# Patient Record
Sex: Male | Born: 1950 | Race: Black or African American | Hispanic: No | Marital: Single | Smoking: Former smoker
Health system: Southern US, Community
[De-identification: ages and names within clinical notes are randomized; demographics above are authoritative.]

## PROBLEM LIST (undated history)

## (undated) DIAGNOSIS — K635 Polyp of colon: Secondary | ICD-10-CM

## (undated) DIAGNOSIS — E785 Hyperlipidemia, unspecified: Secondary | ICD-10-CM

## (undated) DIAGNOSIS — J45909 Unspecified asthma, uncomplicated: Secondary | ICD-10-CM

## (undated) DIAGNOSIS — I10 Essential (primary) hypertension: Secondary | ICD-10-CM

## (undated) HISTORY — DX: Polyp of colon: K63.5

## (undated) HISTORY — DX: Essential (primary) hypertension: I10

## (undated) HISTORY — DX: Unspecified asthma, uncomplicated: J45.909

---
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
## 2008-03-05 ENCOUNTER — Ambulatory Visit: Payer: Self-pay | Admitting: Gastroenterology

## 2008-03-18 ENCOUNTER — Ambulatory Visit: Payer: Self-pay | Admitting: Gastroenterology

## 2008-03-18 ENCOUNTER — Encounter: Payer: Self-pay | Admitting: Gastroenterology

## 2008-03-20 ENCOUNTER — Encounter: Payer: Self-pay | Admitting: Gastroenterology

## 2013-01-18 ENCOUNTER — Encounter: Payer: Self-pay | Admitting: Gastroenterology

## 2013-02-20 ENCOUNTER — Telehealth: Payer: Self-pay | Admitting: *Deleted

## 2013-02-20 ENCOUNTER — Encounter: Payer: Self-pay | Admitting: *Deleted

## 2013-02-20 NOTE — Telephone Encounter (Signed)
Pt no show for previsit appointment. Message left to call and reschedule previsit before 5pm today. If not rescheduled today before 5p, colonoscopy appointment will be cancelled as well and both previsit and colonoscopy will need to be rescheduled.

## 2013-02-23 ENCOUNTER — Encounter: Payer: Self-pay | Admitting: Gastroenterology

## 2013-03-07 ENCOUNTER — Other Ambulatory Visit: Payer: Self-pay | Admitting: Gastroenterology

## 2013-08-20 ENCOUNTER — Encounter: Payer: Self-pay | Admitting: Gastroenterology

## 2013-10-04 HISTORY — PX: COLONOSCOPY W/ BIOPSIES: SHX1374

## 2013-10-11 ENCOUNTER — Other Ambulatory Visit: Payer: Self-pay | Admitting: Dermatology

## 2013-10-15 DIAGNOSIS — Z87891 Personal history of nicotine dependence: Secondary | ICD-10-CM | POA: Insufficient documentation

## 2014-01-23 DIAGNOSIS — N529 Male erectile dysfunction, unspecified: Secondary | ICD-10-CM | POA: Insufficient documentation

## 2014-01-23 DIAGNOSIS — R972 Elevated prostate specific antigen [PSA]: Secondary | ICD-10-CM | POA: Insufficient documentation

## 2014-01-23 DIAGNOSIS — J45909 Unspecified asthma, uncomplicated: Secondary | ICD-10-CM | POA: Insufficient documentation

## 2014-01-23 DIAGNOSIS — L210 Seborrhea capitis: Secondary | ICD-10-CM | POA: Insufficient documentation

## 2014-01-31 ENCOUNTER — Encounter: Payer: Self-pay | Admitting: Gastroenterology

## 2014-02-28 ENCOUNTER — Ambulatory Visit (AMBULATORY_SURGERY_CENTER): Payer: Self-pay | Admitting: *Deleted

## 2014-02-28 VITALS — Ht 75.0 in | Wt 216.4 lb

## 2014-02-28 DIAGNOSIS — Z86010 Personal history of colonic polyps: Secondary | ICD-10-CM

## 2014-02-28 MED ORDER — MOVIPREP 100 G PO SOLR: ORAL | Status: DC

## 2014-02-28 NOTE — Progress Notes (Signed)
No allergies to eggs or soy. No problems with anesthesia.  Pt given Emmi instructions for colonoscopy  No oxygen use  No diet drug use  

## 2014-03-13 ENCOUNTER — Encounter: Payer: Medicare Other | Admitting: Gastroenterology

## 2014-03-15 ENCOUNTER — Encounter: Payer: Self-pay | Admitting: Gastroenterology

## 2014-03-18 ENCOUNTER — Encounter: Payer: Self-pay | Admitting: Gastroenterology

## 2014-03-18 ENCOUNTER — Ambulatory Visit (AMBULATORY_SURGERY_CENTER): Payer: Medicare Other | Admitting: Gastroenterology

## 2014-03-18 VITALS — BP 129/78 | HR 76 | Temp 96.8°F | Resp 15 | Ht 75.0 in | Wt 216.0 lb

## 2014-03-18 DIAGNOSIS — Z86010 Personal history of colonic polyps: Secondary | ICD-10-CM

## 2014-03-18 DIAGNOSIS — D128 Benign neoplasm of rectum: Secondary | ICD-10-CM

## 2014-03-18 DIAGNOSIS — D126 Benign neoplasm of colon, unspecified: Secondary | ICD-10-CM

## 2014-03-18 DIAGNOSIS — D129 Benign neoplasm of anus and anal canal: Secondary | ICD-10-CM

## 2014-03-18 MED ORDER — SODIUM CHLORIDE 0.9 % IV SOLN: 500.0000 mL | INTRAVENOUS | Status: DC

## 2014-03-18 NOTE — Progress Notes (Signed)
Called to room to assist during endoscopic procedure.  Patient ID and intended procedure confirmed with present staff. Received instructions for my participation in the procedure from the performing physician.  

## 2014-03-18 NOTE — Progress Notes (Signed)
Report to PACU, RN, vss, BBS= Clear.  

## 2014-03-18 NOTE — Progress Notes (Addendum)
Pt. Unable to expell air even with repositioning.1547 hyoscyamine SL administered. Abdomen soft non distended.1558 Pt. Ambulated to bathroom without difficulty. Pt. 9675 pt. Expelling air in bathroom and denies pain. Dr. Ardis Hughs made aware. Order received to D/C.

## 2014-03-18 NOTE — Op Note (Signed)
[facility]  [address] [place], [id number]   COLONOSCOPY PROCEDURE REPORT  PATIENT: Gregery, Walberg  MR#: [id number] BIRTHDATE: [birth year] , 5  yrs. old GENDER: Male ENDOSCOPIST: Milus Banister, MD PROCEDURE DATE:  03/18/2014 PROCEDURE:   Colonoscopy with snare polypectomy First Screening Colonoscopy - Avg.  risk and is 50 yrs.  old or older - No.  Prior Negative Screening - Now for repeat screening. N/A  History of Adenoma - Now for follow-up colonoscopy & has been > or = to 3 yrs.  Yes hx of adenoma.  Has been 3 or more years since last colonoscopy.  Polyps Removed Today? Yes. ASA CLASS:   Class II INDICATIONS:single subcentimeter TA removed 2009. MEDICATIONS: MAC sedation, administered by CRNA and propofol (Diprivan) 300mg  IV  DESCRIPTION OF PROCEDURE:   After the risks benefits and alternatives of the procedure were thoroughly explained, informed consent was obtained.  A digital rectal exam revealed no abnormalities of the rectum.   The LB [id number]  endoscope was introduced through the anus and advanced to the cecum, which was identified by both the appendix and ileocecal valve. No adverse events experienced.   The quality of the prep was good.  The instrument was then slowly withdrawn as the colon was fully examined.  COLON FINDINGS: Two small polyps were found, removed and sent to pathology.  These were both sessile, located in descending and rectum segments, 1-15mm across, removed with cold snare.  The examination was otherwise normal.  Retroflexed views revealed no abnormalities. The time to cecum=2 minutes 18 seconds.  Withdrawal time=11 minutes 21 seconds.  The scope was withdrawn and the procedure completed. COMPLICATIONS: There were no complications.  ENDOSCOPIC IMPRESSION: Two small polyps were found, removed and sent to pathology. The examination was otherwise normal.  RECOMMENDATIONS: If the polyp(s) removed today are proven  to be adenomatous (pre-cancerous) polyps, you will need a repeat colonoscopy in 5 years.  Otherwise you should continue to follow colorectal cancer screening guidelines for "routine risk" patients with colonoscopy in 10 years.  You will receive a letter within 1-2 weeks with the results of your biopsy as well as final recommendations.  Please call my office if you have not received a letter after 3 weeks.   eSigned:  Milus Banister, MD 03/18/2014 3:24 PM

## 2014-03-18 NOTE — Patient Instructions (Signed)
YOU HAD AN ENDOSCOPIC PROCEDURE TODAY AT THE  ENDOSCOPY CENTER: Refer to the procedure report that was given to you for any specific questions about what was found during the examination.  If the procedure report does not answer your questions, please call your gastroenterologist to clarify.  If you requested that your care partner not be given the details of your procedure findings, then the procedure report has been included in a sealed envelope for you to review at your convenience later.  YOU SHOULD EXPECT: Some feelings of bloating in the abdomen. Passage of more gas than usual.  Walking can help get rid of the air that was put into your GI tract during the procedure and reduce the bloating. If you had a lower endoscopy (such as a colonoscopy or flexible sigmoidoscopy) you may notice spotting of blood in your stool or on the toilet paper. If you underwent a bowel prep for your procedure, then you may not have a normal bowel movement for a few days.  DIET: Your first meal following the procedure should be a light meal and then it is ok to progress to your normal diet.  A half-sandwich or bowl of soup is an example of a good first meal.  Heavy or fried foods are harder to digest and may make you feel nauseous or bloated.  Likewise meals heavy in dairy and vegetables can cause extra gas to form and this can also increase the bloating.  Drink plenty of fluids but you should avoid alcoholic beverages for 24 hours.  ACTIVITY: Your care partner should take you home directly after the procedure.  You should plan to take it easy, moving slowly for the rest of the day.  You can resume normal activity the day after the procedure however you should NOT DRIVE or use heavy machinery for 24 hours (because of the sedation medicines used during the test).    SYMPTOMS TO REPORT IMMEDIATELY: A gastroenterologist can be reached at any hour.  During normal business hours, 8:30 AM to 5:00 PM Monday through Friday,  call [phone].  After hours and on weekends, please call the GI answering service at [phone] who will take a message and have the physician on call contact you.   Following lower endoscopy (colonoscopy or flexible sigmoidoscopy):  Excessive amounts of blood in the stool  Significant tenderness or worsening of abdominal pains  Swelling of the abdomen that is new, acute  Fever of 100F or higher  FOLLOW UP: If any biopsies were taken you will be contacted by phone or by letter within the next 1-3 weeks.  Call your gastroenterologist if you have not heard about the biopsies in 3 weeks.  Our staff will call the home number listed on your records the next business day following your procedure to check on you and address any questions or concerns that you may have at that time regarding the information given to you following your procedure. This is a courtesy call and so if there is no answer at the home number and we have not heard from you through the emergency physician on call, we will assume that you have returned to your regular daily activities without incident.  SIGNATURES/CONFIDENTIALITY: You and/or your care partner have signed paperwork which will be entered into your electronic medical record.  These signatures attest to the fact that that the information above on your After Visit Summary has been reviewed and is understood.  Full responsibility of the confidentiality of this   discharge information lies with you and/or your care-partner.    Resume medications. Information given on polyps with discharge instructions. 

## 2014-03-19 ENCOUNTER — Telehealth: Payer: Self-pay | Admitting: *Deleted

## 2014-03-19 NOTE — Telephone Encounter (Signed)
  Follow up Call-  Call back number 03/18/2014  Post procedure Call Back phone  # [phone]  Permission to leave phone message Yes    [facility]

## 2014-03-25 ENCOUNTER — Encounter: Payer: Self-pay | Admitting: Gastroenterology

## 2014-03-28 ENCOUNTER — Encounter: Payer: Self-pay | Admitting: *Deleted

## 2014-03-28 ENCOUNTER — Other Ambulatory Visit (INDEPENDENT_AMBULATORY_CARE_PROVIDER_SITE_OTHER): Payer: Medicare Other

## 2014-03-28 ENCOUNTER — Telehealth: Payer: Self-pay | Admitting: Gastroenterology

## 2014-03-28 DIAGNOSIS — K625 Hemorrhage of anus and rectum: Secondary | ICD-10-CM

## 2014-03-28 LAB — CBC WITH DIFFERENTIAL/PLATELET
Basophils Absolute: 0 10*3/uL (ref 0.0–0.1)
Basophils Relative: 0.5 % (ref 0.0–3.0)
EOS PCT: 8.3 % — AB (ref 0.0–5.0)
Eosinophils Absolute: 0.4 10*3/uL (ref 0.0–0.7)
HEMATOCRIT: 42.2 % (ref 39.0–52.0)
HEMOGLOBIN: 14.2 g/dL (ref 13.0–17.0)
Lymphocytes Relative: 32.5 % (ref 12.0–46.0)
Lymphs Abs: 1.4 10*3/uL (ref 0.7–4.0)
MCHC: 33.5 g/dL (ref 30.0–36.0)
MCV: 94.1 fl (ref 78.0–100.0)
Monocytes Absolute: 0.3 10*3/uL (ref 0.1–1.0)
Monocytes Relative: 7.3 % (ref 3.0–12.0)
NEUTROS ABS: 2.3 10*3/uL (ref 1.4–7.7)
NEUTROS PCT: 51.4 % (ref 43.0–77.0)
Platelets: 235 10*3/uL (ref 150.0–400.0)
RBC: 4.49 Mil/uL (ref 4.22–5.81)
RDW: 13.9 % (ref 11.5–15.5)
WBC: 4.4 10*3/uL (ref 4.0–10.5)

## 2014-03-28 NOTE — Telephone Encounter (Signed)
Pt is having BRB in the toilet every time he has a bowel movement since the procedure, usually once per day.  No constipation, hemorrhoids.  No fever or pain.   Please advise

## 2014-03-28 NOTE — Telephone Encounter (Signed)
Pt aware will have labs today and appt with [place] tomorrow

## 2014-03-28 NOTE — Telephone Encounter (Signed)
He needs cbc today.  ROV with [place] tomorrow or early next week (extender or other available MD?)  I am double booked in AM already tomorrow.

## 2014-03-29 ENCOUNTER — Ambulatory Visit: Payer: Medicare Other | Admitting: Gastroenterology

## 2014-04-08 ENCOUNTER — Telehealth: Payer: Self-pay | Admitting: Gastroenterology

## 2014-04-08 NOTE — Telephone Encounter (Signed)
Left message for pt to call back.  Pt called and states that he continues to have BRB in the stool when he has a BM. States it is on the tissue when he wipes also. Pt states he does not have any burning, itching, or pain in the rectal area. Pt is concerned. Please advise.

## 2014-04-09 NOTE — Telephone Encounter (Signed)
rov with extender this week if possible, next week with me if not.  Results letter was sent 1-2 weeks ago, can you read it to him

## 2014-04-09 NOTE — Telephone Encounter (Signed)
Pt has been notified and appt with Nevin Bloodgood has been scheduled

## 2014-04-12 ENCOUNTER — Encounter: Payer: Self-pay | Admitting: Nurse Practitioner

## 2014-04-12 ENCOUNTER — Ambulatory Visit (INDEPENDENT_AMBULATORY_CARE_PROVIDER_SITE_OTHER): Payer: Medicare Other | Admitting: Nurse Practitioner

## 2014-04-12 VITALS — BP 130/80 | HR 76 | Ht 75.0 in | Wt 215.0 lb

## 2014-04-12 DIAGNOSIS — K625 Hemorrhage of anus and rectum: Secondary | ICD-10-CM

## 2014-04-12 DIAGNOSIS — R1012 Left upper quadrant pain: Secondary | ICD-10-CM

## 2014-04-12 DIAGNOSIS — R110 Nausea: Secondary | ICD-10-CM

## 2014-04-12 MED ORDER — HYDROCORTISONE ACETATE 25 MG RE SUPP: RECTAL | Status: DC

## 2014-04-12 NOTE — Patient Instructions (Signed)
We sent a prescription to [facility]. For Anusol HC suppositories. Avoid straining with bowel movements. You can use over the counter stool softners, examples: Colace, Dulcolax.   Call us after treating with the suppositories if you have any more bleeding.

## 2014-04-12 NOTE — Progress Notes (Signed)
     History of Present Illness:   Patient is a 63 year old male known to Dr. Ardis Hughs. He has a history of colon polyps and underwent surveillance colonoscopy on the 16th of last month. A couple of polyps were removed, one was adenomatous. Since colonoscopy patient has been having some painless rectal bleeding with bowel movements. Bowel movements otherwise unremarkable. No abdominal pain.    Current Medications, Allergies, Past Medical History, Past Surgical History, Family History and Social History were reviewed in [employer] record.   Physical Exam: General: Pleasant, well developed , black male in no acute distress Head: Normocephalic and atraumatic Eyes:  sclerae anicteric, conjunctiva pink  Ears: Normal auditory acuity Rectal: Large external hemorrhoidal tags. inflamed internal hemorrhoids on anoscopy Neurological: Alert oriented x 4, grossly nonfocal Psychological:  Alert and cooperative. Normal mood and affect  Assessment and Recommendations:  16. 63 year old mouth with painless rectal bleeding with bowel movements since colonoscopy last month. He does have internal hemorrhoids on anoscopy. Patient does recall straining excessively after the procedure to expel gas, this could have contributed to the internal hemorrhoids. Discourage straining. Will treat with steroid suppositories nightly for 7 days. Patient will call his back he has persistent bleeding  2. adenomatous colon polyps ( last colonoscopy June of this year).

## 2014-04-14 ENCOUNTER — Encounter: Payer: Self-pay | Admitting: Nurse Practitioner

## 2014-04-14 DIAGNOSIS — K625 Hemorrhage of anus and rectum: Secondary | ICD-10-CM | POA: Insufficient documentation

## 2014-04-15 ENCOUNTER — Telehealth: Payer: Self-pay | Admitting: Nurse Practitioner

## 2014-04-15 NOTE — Telephone Encounter (Signed)
I called and LM for the patient to advise I tried to do a Prior aithorization for the Anusol HC Suppositories.  I even asked for a supervisor but got no where. I was advised these suppositories are excluded from coverage on his plan.  I suggested he purchase 3 which would be About 35.00 and then go to the over the counter suppositories which only have 1 % if hydrocortisone.

## 2014-04-15 NOTE — Progress Notes (Signed)
i agree with the plan above 

## 2014-04-15 NOTE — Telephone Encounter (Signed)
Paul Donaldson,   This patient came in to the office on 04-12-2014 when you where working with Nevin Bloodgood. Do you think prior auth needs to be done or can Nevin Bloodgood change to something besides Anusol Suppositories? Thank you.

## 2014-04-15 NOTE — Telephone Encounter (Signed)
I called and tried to do a prior authorization for the Anuso HC Suppositories.  I was told a prior Josem Kaufmann was already initiated.  I asked who did this and I was told the patient did it.  I spoke to Amid from [place] and I was told under the Social Security act for [employer], Section 23Ab- this type of medication is excluded from coverage.

## 2014-05-03 ENCOUNTER — Telehealth: Payer: Self-pay | Admitting: Nurse Practitioner

## 2014-05-03 MED ORDER — HYDROCORTISONE ACETATE 25 MG RE SUPP: RECTAL | Status: DC

## 2014-05-03 NOTE — Telephone Encounter (Signed)
Sent prescription for Anusol HC Suppositories to patient's pharmacy, [facility] [address]  Called pt to advise.

## 2015-05-01 ENCOUNTER — Encounter: Payer: Self-pay | Admitting: Gastroenterology

## 2015-07-12 ENCOUNTER — Emergency Department (HOSPITAL_COMMUNITY)
Admission: EM | Admit: 2015-07-12 | Discharge: 2015-07-12 | Disposition: A | Discharge status: Home / Self Care | Payer: No Typology Code available for payment source | Attending: Emergency Medicine | Admitting: Emergency Medicine

## 2015-07-12 ENCOUNTER — Encounter (HOSPITAL_COMMUNITY): Payer: Self-pay

## 2015-07-12 DIAGNOSIS — I10 Essential (primary) hypertension: Secondary | ICD-10-CM | POA: Insufficient documentation

## 2015-07-12 DIAGNOSIS — Y9389 Activity, other specified: Secondary | ICD-10-CM | POA: Diagnosis not present

## 2015-07-12 DIAGNOSIS — S39012A Strain of muscle, fascia and tendon of lower back, initial encounter: Secondary | ICD-10-CM | POA: Insufficient documentation

## 2015-07-12 DIAGNOSIS — Y92410 Unspecified street and highway as the place of occurrence of the external cause: Secondary | ICD-10-CM | POA: Insufficient documentation

## 2015-07-12 DIAGNOSIS — Z87891 Personal history of nicotine dependence: Secondary | ICD-10-CM | POA: Insufficient documentation

## 2015-07-12 DIAGNOSIS — Z79899 Other long term (current) drug therapy: Secondary | ICD-10-CM | POA: Diagnosis not present

## 2015-07-12 DIAGNOSIS — Z86010 Personal history of colonic polyps: Secondary | ICD-10-CM | POA: Insufficient documentation

## 2015-07-12 DIAGNOSIS — J45909 Unspecified asthma, uncomplicated: Secondary | ICD-10-CM | POA: Diagnosis not present

## 2015-07-12 DIAGNOSIS — Z7982 Long term (current) use of aspirin: Secondary | ICD-10-CM | POA: Insufficient documentation

## 2015-07-12 DIAGNOSIS — Y998 Other external cause status: Secondary | ICD-10-CM | POA: Diagnosis not present

## 2015-07-12 DIAGNOSIS — Z7952 Long term (current) use of systemic steroids: Secondary | ICD-10-CM | POA: Insufficient documentation

## 2015-07-12 DIAGNOSIS — S3992XA Unspecified injury of lower back, initial encounter: Secondary | ICD-10-CM | POA: Diagnosis present

## 2015-07-12 MED ORDER — METHOCARBAMOL 500 MG PO TABS: 1000.0000 mg | ORAL_TABLET | Freq: Three times a day (TID) | ORAL | Status: DC | PRN
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
## 2015-07-12 NOTE — Discharge Instructions (Signed)
For pain control you may take up to 800mg  of Motrin (also known as ibuprofen). That is usually 4 over the counter pills,  3 times a day. Take with food to minimize stomach irritation   You can also take  tylenol (acetaminophen) 975mg  (this is 3 over the counter pills) four times a day. Do not drink alcohol or combine with other medications that have acetaminophen as an ingredient (Read the labels!).    For breakthrough pain you may take Robaxin. Do not drink alcohol, drive or operate heavy machinery when taking Robaxin.   Low Back Strain With Rehab A strain is an injury in which a tendon or muscle is torn. The muscles and tendons of the lower back are vulnerable to strains. However, these muscles and tendons are very strong and require a great force to be injured. Strains are classified into three categories. Grade 1 strains cause pain, but the tendon is not lengthened. Grade 2 strains include a lengthened ligament, due to the ligament being stretched or partially ruptured. With grade 2 strains there is still function, although the function may be decreased. Grade 3 strains involve a complete tear of the tendon or muscle, and function is usually impaired. SYMPTOMS   Pain in the lower back.  Pain that affects one side more than the other.  Pain that gets worse with movement and may be felt in the hip, buttocks, or back of the thigh.  Muscle spasms of the muscles in the back.  Swelling along the muscles of the back.  Loss of strength of the back muscles.  Crackling sound (crepitation) when the muscles are touched. CAUSES  Lower back strains occur when a force is placed on the muscles or tendons that is greater than they can handle. Common causes of injury include:  Prolonged overuse of the muscle-tendon units in the lower back, usually from incorrect posture.  A single violent injury or force applied to the back. RISK INCREASES WITH:  Sports that involve twisting forces on the spine or  a lot of bending at the waist (football, rugby, weightlifting, bowling, golf, tennis, speed skating, racquetball, swimming, running, gymnastics, diving).  Poor strength and flexibility.  Failure to warm up properly before activity.  Family history of lower back pain or disk disorders.  Previous back injury or surgery (especially fusion).  Poor posture with lifting, especially heavy objects.  Prolonged sitting, especially with poor posture. PREVENTION   Learn and use proper posture when sitting or lifting (maintain proper posture when sitting, lift using the knees and legs, not at the waist).  Warm up and stretch properly before activity.  Allow for adequate recovery between workouts.  Maintain physical fitness:  Strength, flexibility, and endurance.  Cardiovascular fitness. PROGNOSIS  If treated properly, lower back strains usually heal within 6 weeks. RELATED COMPLICATIONS   Recurring symptoms, resulting in a chronic problem.  Chronic inflammation, scarring, and partial muscle-tendon tear.  Delayed healing or resolution of symptoms.  Prolonged disability. TREATMENT  Treatment first involves the use of ice and medicine, to reduce pain and inflammation. The use of strengthening and stretching exercises may help reduce pain with activity. These exercises may be performed at home or with a therapist. Severe injuries may require referral to a therapist for further evaluation and treatment, such as ultrasound. Your caregiver may advise that you wear a back brace or corset, to help reduce pain and discomfort. Often, prolonged bed rest results in greater harm then benefit. Corticosteroid injections may be recommended. However,  these should be reserved for the most serious cases. It is important to avoid using your back when lifting objects. At night, sleep on your back on a firm mattress with a pillow placed under your knees. If non-surgical treatment is unsuccessful, surgery may be  needed.  MEDICATION   If pain medicine is needed, nonsteroidal anti-inflammatory medicines (aspirin and ibuprofen), or other minor pain relievers (acetaminophen), are often advised.  Do not take pain medicine for 7 days before surgery.  Prescription pain relievers may be given, if your caregiver thinks they are needed. Use only as directed and only as much as you need.  Ointments applied to the skin may be helpful.  Corticosteroid injections may be given by your caregiver. These injections should be reserved for the most serious cases, because they may only be given a certain number of times. HEAT AND COLD  Cold treatment (icing) should be applied for 10 to 15 minutes every 2 to 3 hours for inflammation and pain, and immediately after activity that aggravates your symptoms. Use ice packs or an ice massage.  Heat treatment may be used before performing stretching and strengthening activities prescribed by your caregiver, physical therapist, or athletic trainer. Use a heat pack or a warm water soak. SEEK MEDICAL CARE IF:   Symptoms get worse or do not improve in 2 to 4 weeks, despite treatment.  You develop numbness, weakness, or loss of bowel or bladder function.  New, unexplained symptoms develop. (Drugs used in treatment may produce side effects.) EXERCISES  RANGE OF MOTION (ROM) AND STRETCHING EXERCISES - Low Back Strain Most people with lower back pain will find that their symptoms get worse with excessive bending forward (flexion) or arching at the lower back (extension). The exercises which will help resolve your symptoms will focus on the opposite motion.  Your physician, physical therapist or athletic trainer will help you determine which exercises will be most helpful to resolve your lower back pain. Do not complete any exercises without first consulting with your caregiver. Discontinue any exercises which make your symptoms worse until you speak to your caregiver.  If you have  pain, numbness or tingling which travels down into your buttocks, leg or foot, the goal of the therapy is for these symptoms to move closer to your back and eventually resolve. Sometimes, these leg symptoms will get better, but your lower back pain may worsen. This is typically an indication of progress in your rehabilitation. Be very alert to any changes in your symptoms and the activities in which you participated in the 24 hours prior to the change. Sharing this information with your caregiver will allow him/her to most efficiently treat your condition.  These exercises may help you when beginning to rehabilitate your injury. Your symptoms may resolve with or without further involvement from your physician, physical therapist or athletic trainer. While completing these exercises, remember:  Restoring tissue flexibility helps normal motion to return to the joints. This allows healthier, less painful movement and activity.  An effective stretch should be held for at least 30 seconds.  A stretch should never be painful. You should only feel a gentle lengthening or release in the stretched tissue. FLEXION RANGE OF MOTION AND STRETCHING EXERCISES: STRETCH - Flexion, Single Knee to Chest   Lie on a firm bed or floor with both legs extended in front of you.  Keeping one leg in contact with the floor, bring your opposite knee to your chest. Hold your leg in place by either  grabbing behind your thigh or at your knee.  Pull until you feel a gentle stretch in your lower back. Hold __________ seconds.  Slowly release your grasp and repeat the exercise with the opposite side. Repeat __________ times. Complete this exercise __________ times per day.  STRETCH - Flexion, Double Knee to Chest   Lie on a firm bed or floor with both legs extended in front of you.  Keeping one leg in contact with the floor, bring your opposite knee to your chest.  Tense your stomach muscles to support your back and then  lift your other knee to your chest. Hold your legs in place by either grabbing behind your thighs or at your knees.  Pull both knees toward your chest until you feel a gentle stretch in your lower back. Hold __________ seconds.  Tense your stomach muscles and slowly return one leg at a time to the floor. Repeat __________ times. Complete this exercise __________ times per day.  STRETCH - Low Trunk Rotation  Lie on a firm bed or floor. Keeping your legs in front of you, bend your knees so they are both pointed toward the ceiling and your feet are flat on the floor.  Extend your arms out to the side. This will stabilize your upper body by keeping your shoulders in contact with the floor.  Gently and slowly drop both knees together to one side until you feel a gentle stretch in your lower back. Hold for __________ seconds.  Tense your stomach muscles to support your lower back as you bring your knees back to the starting position. Repeat the exercise to the other side. Repeat __________ times. Complete this exercise __________ times per day  EXTENSION RANGE OF MOTION AND FLEXIBILITY EXERCISES: STRETCH - Extension, Prone on Elbows   Lie on your stomach on the floor, a bed will be too soft. Place your palms about shoulder width apart and at the height of your head.  Place your elbows under your shoulders. If this is too painful, stack pillows under your chest.  Allow your body to relax so that your hips drop lower and make contact more completely with the floor.  Hold this position for __________ seconds.  Slowly return to lying flat on the floor. Repeat __________ times. Complete this exercise __________ times per day.  RANGE OF MOTION - Extension, Prone Press Ups  Lie on your stomach on the floor, a bed will be too soft. Place your palms about shoulder width apart and at the height of your head.  Keeping your back as relaxed as possible, slowly straighten your elbows while keeping your  hips on the floor. You may adjust the placement of your hands to maximize your comfort. As you gain motion, your hands will come more underneath your shoulders.  Hold this position __________ seconds.  Slowly return to lying flat on the floor. Repeat __________ times. Complete this exercise __________ times per day.  RANGE OF MOTION- Quadruped, Neutral Spine   Assume a hands and knees position on a firm surface. Keep your hands under your shoulders and your knees under your hips. You may place padding under your knees for comfort.  Drop your head and point your tail bone toward the ground below you. This will round out your lower back like an angry cat. Hold this position for __________ seconds.  Slowly lift your head and release your tail bone so that your back sags into a large arch, like an old horse.  Hold  this position for __________ seconds.  Repeat this until you feel limber in your lower back.  Now, find your "sweet spot." This will be the most comfortable position somewhere between the two previous positions. This is your neutral spine. Once you have found this position, tense your stomach muscles to support your lower back.  Hold this position for __________ seconds. Repeat __________ times. Complete this exercise __________ times per day.  STRENGTHENING EXERCISES - Low Back Strain These exercises may help you when beginning to rehabilitate your injury. These exercises should be done near your "sweet spot." This is the neutral, low-back arch, somewhere between fully rounded and fully arched, that is your least painful position. When performed in this safe range of motion, these exercises can be used for people who have either a flexion or extension based injury. These exercises may resolve your symptoms with or without further involvement from your physician, physical therapist or athletic trainer. While completing these exercises, remember:   Muscles can gain both the endurance  and the strength needed for everyday activities through controlled exercises.  Complete these exercises as instructed by your physician, physical therapist or athletic trainer. Increase the resistance and repetitions only as guided.  You may experience muscle soreness or fatigue, but the pain or discomfort you are trying to eliminate should never worsen during these exercises. If this pain does worsen, stop and make certain you are following the directions exactly. If the pain is still present after adjustments, discontinue the exercise until you can discuss the trouble with your caregiver. STRENGTHENING - Deep Abdominals, Pelvic Tilt  Lie on a firm bed or floor. Keeping your legs in front of you, bend your knees so they are both pointed toward the ceiling and your feet are flat on the floor.  Tense your lower abdominal muscles to press your lower back into the floor. This motion will rotate your pelvis so that your tail bone is scooping upwards rather than pointing at your feet or into the floor.  With a gentle tension and even breathing, hold this position for __________ seconds. Repeat __________ times. Complete this exercise __________ times per day.  STRENGTHENING - Abdominals, Crunches   Lie on a firm bed or floor. Keeping your legs in front of you, bend your knees so they are both pointed toward the ceiling and your feet are flat on the floor. Cross your arms over your chest.  Slightly tip your chin down without bending your neck.  Tense your abdominals and slowly lift your trunk high enough to just clear your shoulder blades. Lifting higher can put excessive stress on the lower back and does not further strengthen your abdominal muscles.  Control your return to the starting position. Repeat __________ times. Complete this exercise __________ times per day.  STRENGTHENING - Quadruped, Opposite UE/LE Lift   Assume a hands and knees position on a firm surface. Keep your hands under your  shoulders and your knees under your hips. You may place padding under your knees for comfort.  Find your neutral spine and gently tense your abdominal muscles so that you can maintain this position. Your shoulders and hips should form a rectangle that is parallel with the floor and is not twisted.  Keeping your trunk steady, lift your right hand no higher than your shoulder and then your left leg no higher than your hip. Make sure you are not holding your breath. Hold this position __________ seconds.  Continuing to keep your abdominal muscles tense and  your back steady, slowly return to your starting position. Repeat with the opposite arm and leg. Repeat __________ times. Complete this exercise __________ times per day.  STRENGTHENING - Lower Abdominals, Double Knee Lift  Lie on a firm bed or floor. Keeping your legs in front of you, bend your knees so they are both pointed toward the ceiling and your feet are flat on the floor.  Tense your abdominal muscles to brace your lower back and slowly lift both of your knees until they come over your hips. Be certain not to hold your breath.  Hold __________ seconds. Using your abdominal muscles, return to the starting position in a slow and controlled manner. Repeat __________ times. Complete this exercise __________ times per day.  POSTURE AND BODY MECHANICS CONSIDERATIONS - Low Back Strain Keeping correct posture when sitting, standing or completing your activities will reduce the stress put on different body tissues, allowing injured tissues a chance to heal and limiting painful experiences. The following are general guidelines for improved posture. Your physician or physical therapist will provide you with any instructions specific to your needs. While reading these guidelines, remember:  The exercises prescribed by your provider will help you have the flexibility and strength to maintain correct postures.  The correct posture provides the best  environment for your joints to work. All of your joints have less wear and tear when properly supported by a spine with good posture. This means you will experience a healthier, less painful body.  Correct posture must be practiced with all of your activities, especially prolonged sitting and standing. Correct posture is as important when doing repetitive low-stress activities (typing) as it is when doing a single heavy-load activity (lifting). RESTING POSITIONS Consider which positions are most painful for you when choosing a resting position. If you have pain with flexion-based activities (sitting, bending, stooping, squatting), choose a position that allows you to rest in a less flexed posture. You would want to avoid curling into a fetal position on your side. If your pain worsens with extension-based activities (prolonged standing, working overhead), avoid resting in an extended position such as sleeping on your stomach. Most people will find more comfort when they rest with their spine in a more neutral position, neither too rounded nor too arched. Lying on a non-sagging bed on your side with a pillow between your knees, or on your back with a pillow under your knees will often provide some relief. Keep in mind, being in any one position for a prolonged period of time, no matter how correct your posture, can still lead to stiffness. PROPER SITTING POSTURE In order to minimize stress and discomfort on your spine, you must sit with correct posture. Sitting with good posture should be effortless for a healthy body. Returning to good posture is a gradual process. Many people can work toward this most comfortably by using various supports until they have the flexibility and strength to maintain this posture on their own. When sitting with proper posture, your ears will fall over your shoulders and your shoulders will fall over your hips. You should use the back of the chair to support your upper back. Your  lower back will be in a neutral position, just slightly arched. You may place a small pillow or folded towel at the base of your lower back for support.  When working at a desk, create an environment that supports good, upright posture. Without extra support, muscles tire, which leads to excessive strain on joints and  other tissues. Keep these recommendations in mind: CHAIR:  A chair should be able to slide under your desk when your back makes contact with the back of the chair. This allows you to work closely.  The chair's height should allow your eyes to be level with the upper part of your monitor and your hands to be slightly lower than your elbows. BODY POSITION  Your feet should make contact with the floor. If this is not possible, use a foot rest.  Keep your ears over your shoulders. This will reduce stress on your neck and lower back. INCORRECT SITTING POSTURES  If you are feeling tired and unable to assume a healthy sitting posture, do not slouch or slump. This puts excessive strain on your back tissues, causing more damage and pain. Healthier options include:  Using more support, like a lumbar pillow.  Switching tasks to something that requires you to be upright or walking.  Talking a brief walk.  Lying down to rest in a neutral-spine position. PROLONGED STANDING WHILE SLIGHTLY LEANING FORWARD  When completing a task that requires you to lean forward while standing in one place for a long time, place either foot up on a stationary 2-4 inch high object to help maintain the best posture. When both feet are on the ground, the lower back tends to lose its slight inward curve. If this curve flattens (or becomes too large), then the back and your other joints will experience too much stress, tire more quickly, and can cause pain. CORRECT STANDING POSTURES Proper standing posture should be assumed with all daily activities, even if they only take a few moments, like when brushing your  teeth. As in sitting, your ears should fall over your shoulders and your shoulders should fall over your hips. You should keep a slight tension in your abdominal muscles to brace your spine. Your tailbone should point down to the ground, not behind your body, resulting in an over-extended swayback posture.  INCORRECT STANDING POSTURES  Common incorrect standing postures include a forward head, locked knees and/or an excessive swayback. WALKING Walk with an upright posture. Your ears, shoulders and hips should all line-up. PROLONGED ACTIVITY IN A FLEXED POSITION When completing a task that requires you to bend forward at your waist or lean over a low surface, try to find a way to stabilize 3 out of 4 of your limbs. You can place a hand or elbow on your thigh or rest a knee on the surface you are reaching across. This will provide you more stability so that your muscles do not fatigue as quickly. By keeping your knees relaxed, or slightly bent, you will also reduce stress across your lower back. CORRECT LIFTING TECHNIQUES DO :   Assume a wide stance. This will provide you more stability and the opportunity to get as close as possible to the object which you are lifting.  Tense your abdominals to brace your spine. Bend at the knees and hips. Keeping your back locked in a neutral-spine position, lift using your leg muscles. Lift with your legs, keeping your back straight.  Test the weight of unknown objects before attempting to lift them.  Try to keep your elbows locked down at your sides in order get the best strength from your shoulders when carrying an object.  Always ask for help when lifting heavy or awkward objects. INCORRECT LIFTING TECHNIQUES DO NOT:   Lock your knees when lifting, even if it is a small object.  Bend and  twist. Pivot at your feet or move your feet when needing to change directions.  Assume that you can safely pick up even a paper clip without proper posture.   This  information is not intended to replace advice given to you by your health care provider. Make sure you discuss any questions you have with your health care provider.   Document Released: 09/20/2005 Document Revised: 10/11/2014 Document Reviewed: 01/02/2009 Elsevier Interactive Patient Education [employer].
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
## 2016-02-04 DIAGNOSIS — Z1159 Encounter for screening for other viral diseases: Secondary | ICD-10-CM | POA: Insufficient documentation

## 2016-02-04 DIAGNOSIS — Z0000 Encounter for general adult medical examination without abnormal findings: Secondary | ICD-10-CM | POA: Insufficient documentation

## 2017-08-22 DIAGNOSIS — J4540 Moderate persistent asthma, uncomplicated: Secondary | ICD-10-CM | POA: Insufficient documentation

## 2018-10-13 ENCOUNTER — Encounter: Payer: Self-pay | Admitting: Gastroenterology

## 2018-10-13 ENCOUNTER — Encounter

## 2018-10-13 ENCOUNTER — Ambulatory Visit (INDEPENDENT_AMBULATORY_CARE_PROVIDER_SITE_OTHER): Payer: Medicare Other | Admitting: Gastroenterology

## 2018-10-13 VITALS — BP 132/82 | HR 66 | Ht 75.0 in | Wt 223.4 lb

## 2018-10-13 DIAGNOSIS — K625 Hemorrhage of anus and rectum: Secondary | ICD-10-CM

## 2018-10-13 NOTE — Patient Instructions (Addendum)
Colonoscopy in June 2020. Call if your minor rectal bleeding returns before then.  Thank you for entrusting me with your care and choosing [facility].  Dr Ardis Hughs

## 2018-10-13 NOTE — Progress Notes (Signed)
Review of pertinent gastrointestinal problems: 1.  History of adenomatous colon polyps.  Colonoscopy 2009, a single subcentimeter adenoma was removed.  Colonoscopy June 2015, 2 subcentimeter polyps were removed, 1 of them was adenomatous.  Recommended recall at 5-year interval.    HPI: This is a very pleasant 68 year old man who was sent here by his primary care physician Dr. Luciana Axe  Had minor rectal bleeding several months ago.  He describes it as very light red, pink on just a couple occasions.  Started drinking some type of supplement and the bleeding stopped.    No gi issues around that time or since then.  Specifically he has had no significant constipation, no diarrhea, no abdominal pains..  No FH of colon cancer.  Stable weight.  Chief complaint is minor rectal bleeding   Old Data Reviewed:  CBC through [facility] May 2019 was normal.  Review of systems: Pertinent positive and negative review of systems were noted in the above HPI section. All other review negative.   Past Medical History:  Diagnosis Date  . Asthma   . Colon polyp    TUBULAR ADENOMA (X1)  . Hypertension     Past Surgical History:  Procedure Laterality Date  . COLONOSCOPY W/ BIOPSIES  2015   TUBULAR ADENOMA (X1).  . CYST EXCISION  1969   back    Current Outpatient Medications  Medication Sig Dispense Refill  . amLODipine (NORVASC) 10 MG tablet Take 10 mg by mouth daily.    Marland Kitchen aspirin 81 MG tablet Take 81 mg by mouth every other day.    . Fluticasone-Salmeterol (ADVAIR HFA IN) Inhale into the lungs as needed.    Marland Kitchen Zn-Pyg Afri-Nettle-Saw Palmet (SAW PALMETTO COMPLEX PO) Take by mouth. 250 mg every other day     No current facility-administered medications for this visit.     Allergies as of 10/13/2018  . (No Known Allergies)    Family History  Problem Relation Age of Onset  . Colon cancer Neg Hx   . Esophageal cancer Neg Hx   . Rectal cancer Neg Hx   . Stomach cancer Neg Hx      Social History   Socioeconomic History  . Marital status: Single    Spouse name: Not on file  . Number of children: Not on file  . Years of education: Not on file  . Highest education level: Not on file  Occupational History  . Not on file  Social Needs  . Financial resource strain: Not on file  . Food insecurity:    Worry: Not on file    Inability: Not on file  . Transportation needs:    Medical: Not on file    Non-medical: Not on file  Tobacco Use  . Smoking status: Former Smoker    Last attempt to quit: 09/10/1996    Years since quitting: 22.1  . Smokeless tobacco: Never Used  Substance and Sexual Activity  . Alcohol use: No  . Drug use: No  . Sexual activity: Not on file  Lifestyle  . Physical activity:    Days per week: Not on file    Minutes per session: Not on file  . Stress: Not on file  Relationships  . Social connections:    Talks on phone: Not on file    Gets together: Not on file    Attends religious service: Not on file    Active member of club or organization: Not on file    Attends meetings  of clubs or organizations: Not on file    Relationship status: Not on file  . Intimate partner violence:    Fear of current or ex partner: Not on file    Emotionally abused: Not on file    Physically abused: Not on file    Forced sexual activity: Not on file  Other Topics Concern  . Not on file  Social History Narrative  . Not on file     Physical Exam: BP 132/82   Pulse 66   Ht 6\' 3"  (1.905 m)   Wt 223 lb 6.4 oz (101.3 kg)   SpO2 96%   BMI 27.92 kg/m  Constitutional: generally well-appearing Psychiatric: alert and oriented x3 Eyes: extraocular movements intact Mouth: oral pharynx moist, no lesions Neck: supple no lymphadenopathy Cardiovascular: heart regular rate and rhythm Lungs: clear to auscultation bilaterally Abdomen: soft, nontender, nondistended, no obvious ascites, no peritoneal signs, normal bowel sounds Extremities: no lower  extremity edema bilaterally Skin: no lesions on visible extremities Rectal exam deferred for upcoming colonoscopy  Assessment and plan: 68 y.o. male with minor rectal bleeding several months ago.  He is not anemic.  He has had no bowel issues.  I reassured him that it is very unlikely that this is anything serious since the bleeding stopped and has not returned and it was very minor around the time.  He has had colon polyps, precancerous in the past and he is due for a repeat colonoscopy in the next few months.  He knows to continue with that plan for surveillance colonoscopy when the time is due.  I see no reason for any further blood tests or imaging studies prior to then.    Please see the "Patient Instructions" section for addition details about the plan.   Owens Loffler, MD [facility] 10/13/2018, 8:57 AM  Cc: Precious Haws, MD

## 2018-12-01 ENCOUNTER — Telehealth: Payer: Self-pay | Admitting: Gastroenterology

## 2018-12-01 NOTE — Telephone Encounter (Signed)
The pt was last seen 10/13/18 for minor rectal bleeding and was told to call if bleeding returns.  He is due for colon June of this year.  Last week he had a return of light rectal bleeding with BM.  Do you want to see him again or setup colon?  Please advise

## 2018-12-01 NOTE — Telephone Encounter (Signed)
Pt states that he has been experiencing rectal bleeding. He is requesting to be seen asap. Dr. Ardis Hughs is scheduling in April and Nevin Bloodgood does not have anything until 3/11. Pt states that he will travel on 3/12 so would like something a lot sooner, maybe next week. Pls call him.

## 2018-12-04 NOTE — Telephone Encounter (Signed)
The patient has been notified of this information and all questions answered.  The pt was scheduled for previsit and colon. The pt has been advised of the information and verbalized understanding.

## 2018-12-04 NOTE — Telephone Encounter (Signed)
He needs colonoscopy for further evaluation, at his soonest convenience.  Thanks

## 2018-12-04 NOTE — Telephone Encounter (Signed)
Left message on machine to call back  

## 2018-12-20 ENCOUNTER — Encounter: Payer: Medicare Other | Admitting: Gastroenterology

## 2019-03-26 ENCOUNTER — Encounter: Payer: Self-pay | Admitting: Gastroenterology

## 2019-10-03 ENCOUNTER — Ambulatory Visit: Payer: Medicare Other | Attending: Internal Medicine

## 2019-10-03 DIAGNOSIS — Z20822 Contact with and (suspected) exposure to covid-19: Secondary | ICD-10-CM

## 2019-10-04 LAB — NOVEL CORONAVIRUS, NAA: SARS-CoV-2, NAA: NOT DETECTED

## 2020-04-08 ENCOUNTER — Emergency Department (HOSPITAL_COMMUNITY)
Admission: EM | Admit: 2020-04-08 | Discharge: 2020-04-08 | Disposition: A | Discharge status: Home / Self Care | Payer: Medicare Other | Attending: Emergency Medicine | Admitting: Emergency Medicine

## 2020-04-08 ENCOUNTER — Other Ambulatory Visit: Payer: Self-pay

## 2020-04-08 DIAGNOSIS — I10 Essential (primary) hypertension: Secondary | ICD-10-CM | POA: Diagnosis not present

## 2020-04-08 DIAGNOSIS — J45909 Unspecified asthma, uncomplicated: Secondary | ICD-10-CM | POA: Insufficient documentation

## 2020-04-08 DIAGNOSIS — Z79899 Other long term (current) drug therapy: Secondary | ICD-10-CM | POA: Insufficient documentation

## 2020-04-08 DIAGNOSIS — Z7982 Long term (current) use of aspirin: Secondary | ICD-10-CM | POA: Insufficient documentation

## 2020-04-08 DIAGNOSIS — R2242 Localized swelling, mass and lump, left lower limb: Secondary | ICD-10-CM | POA: Diagnosis not present

## 2020-04-08 DIAGNOSIS — N390 Urinary tract infection, site not specified: Secondary | ICD-10-CM | POA: Diagnosis not present

## 2020-04-08 DIAGNOSIS — R319 Hematuria, unspecified: Secondary | ICD-10-CM | POA: Diagnosis not present

## 2020-04-08 DIAGNOSIS — Z87891 Personal history of nicotine dependence: Secondary | ICD-10-CM | POA: Insufficient documentation

## 2020-04-08 DIAGNOSIS — R531 Weakness: Secondary | ICD-10-CM | POA: Diagnosis present

## 2020-04-08 LAB — URINALYSIS, ROUTINE W REFLEX MICROSCOPIC
Bilirubin Urine: NEGATIVE
Glucose, UA: NEGATIVE mg/dL
Ketones, ur: NEGATIVE mg/dL
Nitrite: NEGATIVE
Protein, ur: >=300 mg/dL — AB
RBC / HPF: >50 RBC/hpf — ABNORMAL HIGH (ref 0–5)
Specific Gravity, Urine: 1.012 (ref 1.005–1.030)
WBC, UA: >50 WBC/hpf — ABNORMAL HIGH (ref 0–5)
pH: 5 (ref 5.0–8.0)

## 2020-04-08 LAB — BASIC METABOLIC PANEL
Anion gap: 8 (ref 5–15)
BUN: 50 mg/dL — ABNORMAL HIGH (ref 8–23)
CO2: 27 mmol/L (ref 22–32)
Calcium: 8.5 mg/dL — ABNORMAL LOW (ref 8.9–10.3)
Chloride: 105 mmol/L (ref 98–111)
Creatinine, Ser: 3.72 mg/dL — ABNORMAL HIGH (ref 0.61–1.24)
GFR calc Af Amer: 18 mL/min — ABNORMAL LOW (ref >60)
GFR calc non Af Amer: 15 mL/min — ABNORMAL LOW (ref >60)
Glucose, Bld: 95 mg/dL (ref 70–99)
Potassium: 4.7 mmol/L (ref 3.5–5.1)
Sodium: 140 mmol/L (ref 135–145)

## 2020-04-08 LAB — CBC
HCT: 41.5 % (ref 39.0–52.0)
Hemoglobin: 13.4 g/dL (ref 13.0–17.0)
MCH: 31.5 pg (ref 26.0–34.0)
MCHC: 32.3 g/dL (ref 30.0–36.0)
MCV: 97.6 fL (ref 80.0–100.0)
Platelets: 203 10*3/uL (ref 150–400)
RBC: 4.25 MIL/uL (ref 4.22–5.81)
RDW: 13.4 % (ref 11.5–15.5)
WBC: 7.8 10*3/uL (ref 4.0–10.5)
nRBC: 0 % (ref 0.0–0.2)

## 2020-04-08 MED ORDER — CEPHALEXIN 500 MG PO CAPS: 500.0000 mg | ORAL_CAPSULE | Freq: Four times a day (QID) | ORAL | 0 refills | Status: DC

## 2020-04-08 MED ORDER — CEPHALEXIN 500 MG PO CAPS
500.0000 mg | ORAL_CAPSULE | Freq: Once | ORAL | Status: AC
  Administered 2020-04-08: 500 mg via ORAL
  Filled 2020-04-08: qty 1

## 2020-04-08 MED ORDER — SODIUM CHLORIDE 0.9% FLUSH: 3.0000 mL | Freq: Once | INTRAVENOUS | Status: DC

## 2020-04-08 NOTE — ED Triage Notes (Signed)
Patient reports to the ER for Weakness, bilateral leg swelling, feeling tired, emesis, and fevers x4 days. Patient is alert and oriented. Patient denies Chest pain or SOB.

## 2020-04-08 NOTE — ED Provider Notes (Signed)
[facility] DEPT Provider Note   CSN: [id number] Arrival date & time: 04/08/20  0957     History Chief Complaint  Patient presents with  . Leg Swelling  . Weakness    Paul Donaldson is a 69 y.o. male.  HPI He presents for evaluation of general weakness and ill feeling, present for several days.  He also is concerned about some swelling in his left leg which he has girlfriend noticed.  He denies shortness of breath, nausea, vomiting, fever, chills, cough, rhinorrhea, sore throat, altered taste or smell.  He has not had Covid vaccine yet.  He has not had a Covid infection.  No known sick contacts.  His urine has been dark on and off for several days.  He is taking his usual medications.  There are no other known modifying factors.    Past Medical History:  Diagnosis Date  . Asthma   . Colon polyp    TUBULAR ADENOMA (X1)  . Hypertension     Patient Active Problem List   Diagnosis Date Noted  . Rectal bleeding 04/14/2014  . Nausea alone 04/12/2014  . LUQ pain 04/12/2014    Past Surgical History:  Procedure Laterality Date  . COLONOSCOPY W/ BIOPSIES  2015   TUBULAR ADENOMA (X1).  . CYST EXCISION  1969   back       Family History  Problem Relation Age of Onset  . Colon cancer Neg Hx   . Esophageal cancer Neg Hx   . Rectal cancer Neg Hx   . Stomach cancer Neg Hx     Social History   Tobacco Use  . Smoking status: Former Smoker    Quit date: 09/10/1996    Years since quitting: 23.5  . Smokeless tobacco: Never Used  Substance Use Topics  . Alcohol use: No  . Drug use: No    Home Medications Prior to Admission medications   Medication Sig Start Date End Date Taking? Authorizing Provider  amLODipine (NORVASC) 10 MG tablet Take 10 mg by mouth daily.    [provider]  aspirin 81 MG tablet Take 81 mg by mouth every other day.    [provider]  atorvastatin (LIPITOR) 10 MG tablet Take 10 mg by mouth daily.  01/29/20   [provider]  BREO ELLIPTA 200-25 MCG/INH AEPB Inhale 1 puff into the lungs daily. 01/31/20   [provider]  cephALEXin (KEFLEX) 500 MG capsule Take 1 capsule (500 mg total) by mouth 4 (four) times daily. 04/08/20   Daleen Bo, MD  Fluticasone-Salmeterol (ADVAIR HFA IN) Inhale into the lungs as needed.    [provider]  Zn-Pyg Afri-Nettle-Saw Palmet (SAW PALMETTO COMPLEX PO) Take by mouth. 250 mg every other day    [provider]    Allergies    Patient has no known allergies.  Review of Systems   Review of Systems  All other systems reviewed and are negative.   Physical Exam Updated Vital Signs BP (!) 187/82 Comment: pt has not taking bp meds  Pulse (!) 101   Temp 98.4 F (36.9 C) (Oral)   Resp 16   SpO2 96%   Physical Exam Vitals and nursing note reviewed.  Constitutional:      General: He is not in acute distress.    Appearance: He is well-developed. He is not ill-appearing or toxic-appearing.  HENT:     Head: Normocephalic and atraumatic.     Right Ear: External ear normal.  Left Ear: External ear normal.  Eyes:     Conjunctiva/sclera: Conjunctivae normal.     Pupils: Pupils are equal, round, and reactive to light.  Neck:     Trachea: Phonation normal.  Cardiovascular:     Rate and Rhythm: Normal rate and regular rhythm.     Heart sounds: Normal heart sounds.  Pulmonary:     Effort: Pulmonary effort is normal.     Breath sounds: Normal breath sounds.  Abdominal:     General: There is no distension.     Palpations: Abdomen is soft.     Tenderness: There is no abdominal tenderness.  Genitourinary:    Comments: No costovertebral angle tenderness. Musculoskeletal:        General: No swelling, tenderness, deformity or signs of injury. Normal range of motion.     Cervical back: Normal range of motion and neck supple.  Skin:    General: Skin is warm and dry.  Neurological:     Mental Status: He is alert and  oriented to person, place, and time.     Cranial Nerves: No cranial nerve deficit.     Sensory: No sensory deficit.     Motor: No abnormal muscle tone.     Coordination: Coordination normal.  Psychiatric:        Mood and Affect: Mood normal.        Behavior: Behavior normal.        Thought Content: Thought content normal.        Judgment: Judgment normal.     ED Results / Procedures / Treatments   Labs (all labs ordered are listed, but only abnormal results are displayed) Labs Reviewed  BASIC METABOLIC PANEL - Abnormal; Notable for the following components:      Result Value   BUN 50 (*)    Creatinine, Ser 3.72 (*)    Calcium 8.5 (*)    GFR calc non Af Amer 15 (*)    GFR calc Af Amer 18 (*)    All other components within normal limits  URINALYSIS, ROUTINE W REFLEX MICROSCOPIC - Abnormal; Notable for the following components:   APPearance HAZY (*)    Hgb urine dipstick LARGE (*)    Protein, ur >=300 (*)    Leukocytes,Ua MODERATE (*)    RBC / HPF >50 (*)    WBC, UA >50 (*)    Bacteria, UA RARE (*)    All other components within normal limits  URINE CULTURE  CBC    EKG None  Radiology No results found.  Procedures Procedures (including critical care time)  Medications Ordered in ED Medications  cephALEXin (KEFLEX) capsule 500 mg (has no administration in time range)    ED Course  I have reviewed the triage vital signs and the nursing notes.  Pertinent labs & imaging results that were available during my care of the patient were reviewed by me and considered in my medical decision making (see chart for details).  Clinical Course as of Apr 09 1223  Tue Apr 08, 2020  1159 Normal  Basic metabolic panel(!) [EW]  7322 Normal  CBC [EW]  1159 Normal except presence of hemoglobin, protein, leukocytes, RBCs and WBCs, with rare bacteria.  Urine culture ordered.  Urinalysis, Routine w reflex microscopic(!) [EW]    Clinical Course User Index [EW] Daleen Bo, MD     MDM Rules/Calculators/A&P  Patient Vitals for the past 24 hrs:  BP Temp Temp src Pulse Resp SpO2  04/08/20 1007 (!) 187/82 98.4 F (36.9 C) Oral (!) 101 16 96 %    12:24 PM Reevaluation with update and discussion. After initial assessment and treatment, an updated evaluation reveals he remains comfortable has no further complaints.  Findings discussed and questions answered. Daleen Bo   Medical Decision Making:  This patient is presenting for evaluation of malaise, with dark urine, which does require a range of treatment options, and is a complaint that involves a moderate risk of morbidity and mortality. The differential diagnoses include UTI, viral infection, nonspecific illness. I decided to review old records, and in summary healthy elderly male with nonspecific symptoms, who appears comfortable initial evaluation.  I do not require additional historical information from anyone.  Clinical Laboratory Tests Ordered, included CBC, Metabolic panel and Urinalysis. Review indicates normal except urinalysis consistent with hemorrhagic cystitis.   Critical Interventions-clinical evaluation, laboratory testing, observation reassessment.  First dose antibiotic started in ED  After These Interventions, the Patient was reevaluated and was found to have a urinary tract infection likely explaining his malaise.  Doubt sepsis, metabolic instability or impending vascular collapse.  CRITICAL CARE-no Performed by: Daleen Bo  Nursing Notes Reviewed/ Care Coordinated Applicable Imaging Reviewed Interpretation of Laboratory Data incorporated into ED treatment  The patient appears reasonably screened and/or stabilized for discharge and I doubt any other medical condition or other [facility] requiring further screening, evaluation, or treatment in the ED at this time prior to discharge.  Plan: Home Medications-OTC analgesia of choice and continue usual medications; Home  Treatments-drink plenty fluids; return here if the recommended treatment, does not improve the symptoms; Recommended follow up-PCP 1 week for checkup and repeat urinalysis.     Final Clinical Impression(s) / ED Diagnoses Final diagnoses:  Urinary tract infection with hematuria, site unspecified    Rx / DC Orders ED Discharge Orders         Ordered    cephALEXin (KEFLEX) 500 MG capsule  4 times daily     Discontinue  Reprint     04/08/20 1211           Daleen Bo, MD 04/08/20 1224

## 2020-04-08 NOTE — Discharge Instructions (Addendum)
Your symptoms are likely related to a UTI.  We are prescribing an antibiotic to improve your condition.  Try to drink extra fluids, especially water to improve your condition.  Follow-up with your doctor next week, for a checkup.

## 2020-04-09 LAB — URINE CULTURE: Culture: NO GROWTH

## 2020-04-15 DIAGNOSIS — R5383 Other fatigue: Secondary | ICD-10-CM | POA: Insufficient documentation

## 2020-04-22 ENCOUNTER — Other Ambulatory Visit: Payer: Self-pay | Admitting: Family Medicine

## 2020-04-22 ENCOUNTER — Other Ambulatory Visit: Payer: Self-pay

## 2020-04-22 ENCOUNTER — Encounter (HOSPITAL_COMMUNITY): Payer: Self-pay | Admitting: Emergency Medicine

## 2020-04-22 ENCOUNTER — Inpatient Hospital Stay (HOSPITAL_COMMUNITY)
Admission: EM | Admit: 2020-04-22 | Discharge: 2020-04-29 | DRG: 684 | Disposition: A | Discharge status: Home / Self Care | Payer: Medicare Other | Attending: Internal Medicine | Admitting: Internal Medicine

## 2020-04-22 DIAGNOSIS — N179 Acute kidney failure, unspecified: Principal | ICD-10-CM | POA: Diagnosis present

## 2020-04-22 DIAGNOSIS — N049 Nephrotic syndrome with unspecified morphologic changes: Secondary | ICD-10-CM | POA: Diagnosis present

## 2020-04-22 DIAGNOSIS — R809 Proteinuria, unspecified: Secondary | ICD-10-CM

## 2020-04-22 DIAGNOSIS — Z86010 Personal history of colonic polyps: Secondary | ICD-10-CM

## 2020-04-22 DIAGNOSIS — N390 Urinary tract infection, site not specified: Secondary | ICD-10-CM | POA: Diagnosis present

## 2020-04-22 DIAGNOSIS — Z79899 Other long term (current) drug therapy: Secondary | ICD-10-CM

## 2020-04-22 DIAGNOSIS — R21 Rash and other nonspecific skin eruption: Secondary | ICD-10-CM | POA: Diagnosis present

## 2020-04-22 DIAGNOSIS — Z87891 Personal history of nicotine dependence: Secondary | ICD-10-CM

## 2020-04-22 DIAGNOSIS — E785 Hyperlipidemia, unspecified: Secondary | ICD-10-CM | POA: Diagnosis present

## 2020-04-22 DIAGNOSIS — Z20822 Contact with and (suspected) exposure to covid-19: Secondary | ICD-10-CM | POA: Diagnosis present

## 2020-04-22 DIAGNOSIS — E876 Hypokalemia: Secondary | ICD-10-CM | POA: Diagnosis not present

## 2020-04-22 DIAGNOSIS — Z7982 Long term (current) use of aspirin: Secondary | ICD-10-CM

## 2020-04-22 DIAGNOSIS — T380X5A Adverse effect of glucocorticoids and synthetic analogues, initial encounter: Secondary | ICD-10-CM | POA: Diagnosis not present

## 2020-04-22 DIAGNOSIS — D649 Anemia, unspecified: Secondary | ICD-10-CM

## 2020-04-22 DIAGNOSIS — R319 Hematuria, unspecified: Secondary | ICD-10-CM

## 2020-04-22 DIAGNOSIS — I10 Essential (primary) hypertension: Secondary | ICD-10-CM | POA: Diagnosis present

## 2020-04-22 DIAGNOSIS — J45909 Unspecified asthma, uncomplicated: Secondary | ICD-10-CM | POA: Diagnosis present

## 2020-04-22 HISTORY — DX: Hyperlipidemia, unspecified: E78.5

## 2020-04-22 LAB — CBC WITH DIFFERENTIAL/PLATELET
Abs Immature Granulocytes: 0.02 10*3/uL (ref 0.00–0.07)
Basophils Absolute: 0 10*3/uL (ref 0.0–0.1)
Basophils Relative: 0 %
Eosinophils Absolute: 0.1 10*3/uL (ref 0.0–0.5)
Eosinophils Relative: 2 %
HCT: 36.5 % — ABNORMAL LOW (ref 39.0–52.0)
Hemoglobin: 11.4 g/dL — ABNORMAL LOW (ref 13.0–17.0)
Immature Granulocytes: 0 %
Lymphocytes Relative: 28 %
Lymphs Abs: 1.8 10*3/uL (ref 0.7–4.0)
MCH: 29.9 pg (ref 26.0–34.0)
MCHC: 31.2 g/dL (ref 30.0–36.0)
MCV: 95.8 fL (ref 80.0–100.0)
Monocytes Absolute: 0.7 10*3/uL (ref 0.1–1.0)
Monocytes Relative: 12 %
Neutro Abs: 3.7 10*3/uL (ref 1.7–7.7)
Neutrophils Relative %: 58 %
Platelets: 167 10*3/uL (ref 150–400)
RBC: 3.81 MIL/uL — ABNORMAL LOW (ref 4.22–5.81)
RDW: 13.2 % (ref 11.5–15.5)
WBC: 6.4 10*3/uL (ref 4.0–10.5)
nRBC: 0 % (ref 0.0–0.2)

## 2020-04-22 LAB — URINALYSIS, ROUTINE W REFLEX MICROSCOPIC
Bilirubin Urine: NEGATIVE
Glucose, UA: 50 mg/dL — AB
Ketones, ur: NEGATIVE mg/dL
Nitrite: NEGATIVE
Protein, ur: >=300 mg/dL — AB
RBC / HPF: >50 RBC/hpf — ABNORMAL HIGH (ref 0–5)
Specific Gravity, Urine: 1.011 (ref 1.005–1.030)
pH: 6 (ref 5.0–8.0)

## 2020-04-22 LAB — COMPREHENSIVE METABOLIC PANEL
ALT: 22 U/L (ref 0–44)
AST: 22 U/L (ref 15–41)
Albumin: 2.7 g/dL — ABNORMAL LOW (ref 3.5–5.0)
Alkaline Phosphatase: 70 U/L (ref 38–126)
Anion gap: 11 (ref 5–15)
BUN: 50 mg/dL — ABNORMAL HIGH (ref 8–23)
CO2: 23 mmol/L (ref 22–32)
Calcium: 8.3 mg/dL — ABNORMAL LOW (ref 8.9–10.3)
Chloride: 107 mmol/L (ref 98–111)
Creatinine, Ser: 4.74 mg/dL — ABNORMAL HIGH (ref 0.61–1.24)
GFR calc Af Amer: 13 mL/min — ABNORMAL LOW (ref >60)
GFR calc non Af Amer: 11 mL/min — ABNORMAL LOW (ref >60)
Glucose, Bld: 88 mg/dL (ref 70–99)
Potassium: 3.3 mmol/L — ABNORMAL LOW (ref 3.5–5.1)
Sodium: 141 mmol/L (ref 135–145)
Total Bilirubin: 1.1 mg/dL (ref 0.3–1.2)
Total Protein: 7.7 g/dL (ref 6.5–8.1)

## 2020-04-22 NOTE — ED Triage Notes (Addendum)
Pt to ED c/o abd pain x's 2-3 weeks.  Pt finished his Keflex.  Pt st's he was told to come to the ED by Dr. Joelyn Oms   To see Dr. Hollie Salk.ref kidney function test

## 2020-04-23 ENCOUNTER — Inpatient Hospital Stay (HOSPITAL_COMMUNITY): Payer: Medicare Other

## 2020-04-23 ENCOUNTER — Emergency Department (HOSPITAL_COMMUNITY): Payer: Medicare Other

## 2020-04-23 ENCOUNTER — Encounter (HOSPITAL_COMMUNITY): Payer: Self-pay | Admitting: Internal Medicine

## 2020-04-23 ENCOUNTER — Other Ambulatory Visit: Payer: Self-pay

## 2020-04-23 DIAGNOSIS — N179 Acute kidney failure, unspecified: Principal | ICD-10-CM

## 2020-04-23 DIAGNOSIS — Z87891 Personal history of nicotine dependence: Secondary | ICD-10-CM | POA: Diagnosis not present

## 2020-04-23 DIAGNOSIS — R21 Rash and other nonspecific skin eruption: Secondary | ICD-10-CM | POA: Diagnosis present

## 2020-04-23 DIAGNOSIS — I10 Essential (primary) hypertension: Secondary | ICD-10-CM | POA: Diagnosis present

## 2020-04-23 DIAGNOSIS — Z20822 Contact with and (suspected) exposure to covid-19: Secondary | ICD-10-CM | POA: Diagnosis present

## 2020-04-23 DIAGNOSIS — E876 Hypokalemia: Secondary | ICD-10-CM | POA: Diagnosis not present

## 2020-04-23 DIAGNOSIS — R809 Proteinuria, unspecified: Secondary | ICD-10-CM | POA: Diagnosis present

## 2020-04-23 DIAGNOSIS — E785 Hyperlipidemia, unspecified: Secondary | ICD-10-CM | POA: Diagnosis present

## 2020-04-23 DIAGNOSIS — Z79899 Other long term (current) drug therapy: Secondary | ICD-10-CM | POA: Diagnosis not present

## 2020-04-23 DIAGNOSIS — N049 Nephrotic syndrome with unspecified morphologic changes: Secondary | ICD-10-CM | POA: Diagnosis present

## 2020-04-23 DIAGNOSIS — D649 Anemia, unspecified: Secondary | ICD-10-CM | POA: Diagnosis present

## 2020-04-23 DIAGNOSIS — Z86010 Personal history of colonic polyps: Secondary | ICD-10-CM | POA: Diagnosis not present

## 2020-04-23 DIAGNOSIS — J45909 Unspecified asthma, uncomplicated: Secondary | ICD-10-CM | POA: Diagnosis present

## 2020-04-23 DIAGNOSIS — T380X5A Adverse effect of glucocorticoids and synthetic analogues, initial encounter: Secondary | ICD-10-CM | POA: Diagnosis not present

## 2020-04-23 DIAGNOSIS — Z7982 Long term (current) use of aspirin: Secondary | ICD-10-CM | POA: Diagnosis not present

## 2020-04-23 LAB — RAPID HIV SCREEN (HIV 1/2 AB+AG)
HIV 1/2 Antibodies: NONREACTIVE
HIV-1 P24 Antigen - HIV24: NONREACTIVE

## 2020-04-23 LAB — PROTEIN / CREATININE RATIO, URINE
Creatinine, Urine: 61.39 mg/dL
Protein Creatinine Ratio: 8.23 mg/mg{Cre} — ABNORMAL HIGH (ref 0.00–0.15)
Total Protein, Urine: 505 mg/dL

## 2020-04-23 LAB — MRSA PCR SCREENING: MRSA by PCR: NEGATIVE

## 2020-04-23 LAB — SARS CORONAVIRUS 2 BY RT PCR (HOSPITAL ORDER, PERFORMED IN ~~LOC~~ HOSPITAL LAB): SARS Coronavirus 2: NEGATIVE

## 2020-04-23 MED ORDER — FLUTICASONE FUROATE-VILANTEROL 200-25 MCG/INH IN AEPB
1.0000 | INHALATION_SPRAY | Freq: Every day | RESPIRATORY_TRACT | Status: DC
  Administered 2020-04-24 – 2020-04-29 (×6): 1 via RESPIRATORY_TRACT
  Filled 2020-04-23: qty 28

## 2020-04-23 MED ORDER — ONDANSETRON HCL 4 MG PO TABS: 4.0000 mg | ORAL_TABLET | Freq: Four times a day (QID) | ORAL | Status: DC | PRN

## 2020-04-23 MED ORDER — SORBITOL 70 % SOLN
30.0000 mL | Status: DC | PRN
  Filled 2020-04-23: qty 30

## 2020-04-23 MED ORDER — ACETAMINOPHEN 325 MG PO TABS: 650.0000 mg | ORAL_TABLET | Freq: Four times a day (QID) | ORAL | Status: DC | PRN

## 2020-04-23 MED ORDER — CALCIUM CARBONATE ANTACID 1250 MG/5ML PO SUSP
500.0000 mg | Freq: Four times a day (QID) | ORAL | Status: DC | PRN
  Filled 2020-04-23: qty 5

## 2020-04-23 MED ORDER — AMLODIPINE BESYLATE 10 MG PO TABS
10.0000 mg | ORAL_TABLET | Freq: Every day | ORAL | Status: DC
  Administered 2020-04-23 – 2020-04-29 (×7): 10 mg via ORAL
  Filled 2020-04-23: qty 2
  Filled 2020-04-23 (×6): qty 1

## 2020-04-23 MED ORDER — LACTATED RINGERS IV SOLN: INTRAVENOUS | Status: DC

## 2020-04-23 MED ORDER — ASPIRIN EC 81 MG PO TBEC
81.0000 mg | DELAYED_RELEASE_TABLET | ORAL | Status: DC
  Administered 2020-04-23 – 2020-04-29 (×4): 81 mg via ORAL
  Filled 2020-04-23 (×4): qty 1

## 2020-04-23 MED ORDER — ATORVASTATIN CALCIUM 10 MG PO TABS
10.0000 mg | ORAL_TABLET | Freq: Every day | ORAL | Status: DC
  Administered 2020-04-23 – 2020-04-28 (×5): 10 mg via ORAL
  Filled 2020-04-23 (×5): qty 1

## 2020-04-23 MED ORDER — DOCUSATE SODIUM 283 MG RE ENEM
1.0000 | ENEMA | RECTAL | Status: DC | PRN
  Filled 2020-04-23: qty 1

## 2020-04-23 MED ORDER — HEPARIN SODIUM (PORCINE) 5000 UNIT/ML IJ SOLN
5000.0000 [IU] | Freq: Three times a day (TID) | INTRAMUSCULAR | Status: AC
  Administered 2020-04-23: 5000 [IU] via SUBCUTANEOUS
  Filled 2020-04-23: qty 1

## 2020-04-23 MED ORDER — CAMPHOR-MENTHOL 0.5-0.5 % EX LOTN
1.0000 "application " | TOPICAL_LOTION | Freq: Three times a day (TID) | CUTANEOUS | Status: DC | PRN
  Administered 2020-04-26: 1 via TOPICAL
  Filled 2020-04-23: qty 222

## 2020-04-23 MED ORDER — HYDROXYZINE HCL 25 MG PO TABS: 25.0000 mg | ORAL_TABLET | Freq: Three times a day (TID) | ORAL | Status: DC | PRN

## 2020-04-23 MED ORDER — ZOLPIDEM TARTRATE 5 MG PO TABS
5.0000 mg | ORAL_TABLET | Freq: Every evening | ORAL | Status: DC | PRN
  Administered 2020-04-24 – 2020-04-28 (×5): 5 mg via ORAL
  Filled 2020-04-23 (×5): qty 1

## 2020-04-23 MED ORDER — PROCHLORPERAZINE EDISYLATE 10 MG/2ML IJ SOLN
10.0000 mg | Freq: Once | INTRAMUSCULAR | Status: AC
  Administered 2020-04-23: 10 mg via INTRAVENOUS
  Filled 2020-04-23: qty 2

## 2020-04-23 MED ORDER — NEPRO/CARBSTEADY PO LIQD
237.0000 mL | Freq: Three times a day (TID) | ORAL | Status: DC | PRN
  Filled 2020-04-23: qty 237

## 2020-04-23 MED ORDER — ACETAMINOPHEN 650 MG RE SUPP: 650.0000 mg | Freq: Four times a day (QID) | RECTAL | Status: DC | PRN

## 2020-04-23 MED ORDER — ONDANSETRON HCL 4 MG/2ML IJ SOLN
4.0000 mg | Freq: Four times a day (QID) | INTRAMUSCULAR | Status: DC | PRN
  Administered 2020-04-24: 4 mg via INTRAVENOUS
  Filled 2020-04-23: qty 2

## 2020-04-23 NOTE — ED Notes (Signed)
Lunch Tray Ordered @ 9292.

## 2020-04-23 NOTE — ED Notes (Signed)
Pt refused blood draw from phlebotomist.

## 2020-04-23 NOTE — H&P (Signed)
History and Physical    MAKAR SLATTER OBS:[id number] DOB: [birth year] DOA: 04/22/2020  PCP: Bartholome Bill, MD Consultants:  Nephrology Patient coming from:  Home; NOK: Yvette Rack, [phone]  Chief Complaint: kidney problems  HPI: Paul Donaldson is a 69 y.o. male with medical history significant of HTN presenting with AKI. He reports that he has been feeling weak and anorexic for several weeks.  He was initially seen on 7/6 at the [facility] ER for similar symptoms; he was diagnosis with a UTI and given Keflex.  He took the Keflex but has not had improvement in his symptoms and has noticed worsening BP elevation as well as brownish colored urine.  He went to his PCP, who attempted to arrange for nephrology evaluation and was ultimately told to come to the ER.    ED Course:  Creatinine 1.7 -> 3.7.  UTI, seen at [facility] recently and started on Keflex.  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  PCP tried to refer to nephrology, no appt available until August.  Having malaise, nausea.  Hgb 13.4 -> 11.4.  No obvious bleeding.  Review of Systems: As per HPI; otherwise review of systems reviewed and negative.   Ambulatory Status:  Ambulates without assistance  COVID Vaccine Status:  First shot  Past Medical History:  Diagnosis Date  . Asthma   . Colon polyp    TUBULAR ADENOMA (X1)  . Dyslipidemia   . Hypertension     Past Surgical History:  Procedure Laterality Date  . COLONOSCOPY W/ BIOPSIES  2015   TUBULAR ADENOMA (X1).  . CYST EXCISION  1969   back    Social History   Socioeconomic History  . Marital status: Single    Spouse name: Not on file  . Number of children: Not on file  . Years of education: Not on file  . Highest education level: Not on file  Occupational History  . Occupation: retired  Tobacco Use  . Smoking status: Former Smoker    Quit date: 09/10/1996    Years since quitting: 23.6  . Smokeless tobacco: Never Used  Substance and Sexual Activity  .  Alcohol use: No  . Drug use: No  . Sexual activity: Not on file  Other Topics Concern  . Not on file  Social History Narrative  . Not on file   Social Determinants of Health   Financial Resource Strain:   . Difficulty of Paying Living Expenses:   Food Insecurity:   . Worried About Charity fundraiser in the Last Year:   . Arboriculturist in the Last Year:   Transportation Needs:   . Film/video editor (Medical):   Marland Kitchen Lack of Transportation (Non-Medical):   Physical Activity:   . Days of Exercise per Week:   . Minutes of Exercise per Session:   Stress:   . Feeling of Stress :   Social Connections:   . Frequency of Communication with Friends and Family:   . Frequency of Social Gatherings with Friends and Family:   . Attends Religious Services:   . Active Member of Clubs or Organizations:   . Attends Archivist Meetings:   Marland Kitchen Marital Status:   Intimate Partner Violence:   . Fear of Current or Ex-Partner:   . Emotionally Abused:   Marland Kitchen Physically Abused:   . Sexually Abused:     No Known Allergies  Family History  Problem Relation Age of Onset  . Colon cancer Neg Hx   .  Esophageal cancer Neg Hx   . Rectal cancer Neg Hx   . Stomach cancer Neg Hx     Prior to Admission medications   Medication Sig Start Date End Date Taking? Authorizing Provider  amLODipine (NORVASC) 10 MG tablet Take 10 mg by mouth daily.    [provider]  aspirin 81 MG tablet Take 81 mg by mouth every other day.    [provider]  atorvastatin (LIPITOR) 10 MG tablet Take 10 mg by mouth daily. 01/29/20   [provider]  BREO ELLIPTA 200-25 MCG/INH AEPB Inhale 1 puff into the lungs daily. 01/31/20   [provider]  cephALEXin (KEFLEX) 500 MG capsule Take 1 capsule (500 mg total) by mouth 4 (four) times daily. 04/08/20   Daleen Bo, MD  Fluticasone-Salmeterol (ADVAIR HFA IN) Inhale into the lungs as needed.    [provider]  Zn-Pyg  Afri-Nettle-Saw Palmet (SAW PALMETTO COMPLEX PO) Take by mouth. 250 mg every other day    [provider]    Physical Exam: Vitals:   04/23/20 0420 04/23/20 0801 04/23/20 0830 04/23/20 1543  BP: (!) 167/84 (!) 186/91  (!) 179/98  Pulse: 88 92  82  Resp: 14 14  16   Temp: 99 F (37.2 C)   98.2 F (36.8 C)  TempSrc: Oral     SpO2: 93% 97%  100%  Weight:   97.5 kg   Height:   6\' 3"  (1.905 m)      . General:  Appears calm and comfortable and is NAD, mildly weak appearing . Eyes:  EOMI, normal lids, iris . ENT:  grossly normal hearing, lips & tongue, mmm . Neck:  no LAD, masses or thyromegaly . Cardiovascular:  RRR, no m/r/g. No LE edema.  Marland Kitchen Respiratory:   CTA bilaterally with no wheezes/rales/rhonchi.  Normal respiratory effort. . Abdomen:  soft, NT, ND, NABS . Back:   normal alignment, no CVAT . Skin:  no rash or induration seen on limited exam . Musculoskeletal:  grossly normal tone BUE/BLE, good ROM, no bony abnormality . Psychiatric:  grossly normal mood and affect, speech fluent and appropriate, AOx3 . Neurologic:  CN 2-12 grossly intact, moves all extremities in coordinated fashion    Radiological Exams on Admission: CT ABDOMEN PELVIS WO CONTRAST  Result Date: 04/23/2020 CLINICAL DATA:  Hematuria.  Significantly worsened renal function. EXAM: CT ABDOMEN AND PELVIS WITHOUT CONTRAST TECHNIQUE: Multidetector CT imaging of the abdomen and pelvis was performed following the standard protocol without IV contrast. COMPARISON:  05/15/2014 CT abdomen report (images not available) FINDINGS: Lower chest: Small right pleural effusion.  Bibasilar atelectasis. Hepatobiliary: An approximately 8.6 cm hypoattenuating mass in the posterior right hepatic lobe likely corresponds to the benign giant hemangioma described on the prior CT. The gallbladder is unremarkable. There is no biliary dilatation. Pancreas: Unremarkable. Spleen: Unremarkable. Adrenals/Urinary Tract: Unremarkable adrenal  glands. Mild-to-moderate symmetric perinephric stranding bilaterally. No renal calculi or hydronephrosis. 5.8 cm low-density lesion arising from the lower pole of the left kidney compatible with a cyst. Nondistended bladder. Stomach/Bowel: The stomach is collapsed. There is no evidence of bowel obstruction or inflammation. The appendix is unremarkable. Vascular/Lymphatic: Mild abdominal aortic atherosclerosis without aneurysm. No enlarged lymph nodes. Reproductive: Mildly enlarged prostate. Other: No intraperitoneal free fluid or free air. Small fat containing umbilical hernia. Musculoskeletal: No acute osseous abnormality or suspicious osseous lesion. Moderate disc degeneration at L4-5 and L5-S1. IMPRESSION: 1. Nonspecific bilateral perinephric stranding. No urinary tract calculi or hydronephrosis. 2. Small right  pleural effusion. 3. Known giant hepatic hemangioma. 4. Aortic Atherosclerosis (ICD10-I70.0). Electronically Signed   By: Logan Bores M.D.   On: 04/23/2020 06:06   VAS US RENAL ARTERY DUPLEX  Result Date: 04/23/2020 ABDOMINAL VISCERAL Indications: Worsening AKI Limitations: Patient unable to hold breath/stay awake during exam. Comparison Study: No prior studies. Performing Technologist: Darlin Coco, RDMS  Examination Guidelines: A complete evaluation includes B-mode imaging, spectral Doppler, color Doppler, and power Doppler as needed of all accessible portions of each vessel. Bilateral testing is considered an integral part of a complete examination. Limited examinations for reoccurring indications may be performed as noted.  Duplex Findings: +----------------------+--------+--------+------+--------+ Mesenteric            PSV cm/sEDV cm/sPlaqueComments +----------------------+--------+--------+------+--------+ Aorta Mid               106      24                  +----------------------+--------+--------+------+--------+ Celiac Artery Origin    146      38                   +----------------------+--------+--------+------+--------+ Celiac Artery Proximal  173      48                  +----------------------+--------+--------+------+--------+ SMA Origin              194      20                  +----------------------+--------+--------+------+--------+    +------------------+--------+--------+-------+ Right Renal ArteryPSV cm/sEDV cm/sComment +------------------+--------+--------+-------+ Origin               75      16           +------------------+--------+--------+-------+ Proximal             99      20           +------------------+--------+--------+-------+ Mid                 107      21           +------------------+--------+--------+-------+ Distal               66      15           +------------------+--------+--------+-------+ +-----------------+--------+--------+-------+ Left Renal ArteryPSV cm/sEDV cm/sComment +-----------------+--------+--------+-------+ Origin              81      15           +-----------------+--------+--------+-------+ Proximal            82      15           +-----------------+--------+--------+-------+ Mid                 81      15           +-----------------+--------+--------+-------+ Distal              91      18           +-----------------+--------+--------+-------+ +------------+--------+--------+----+-----------+--------+--------+----+ Right KidneyPSV cm/sEDV cm/sRI  Left KidneyPSV cm/sEDV cm/sRI   +------------+--------+--------+----+-----------+--------+--------+----+ Upper Pole  58      13      0.78Upper Pole 63      12      0.80 +------------+--------+--------+----+-----------+--------+--------+----+ Mid         52  10      0.81Mid        46      9       0.81 +------------+--------+--------+----+-----------+--------+--------+----+ Lower Pole  64      16      0.75Lower Pole 32      10      0.68  +------------+--------+--------+----+-----------+--------+--------+----+ Hilar       59      11      0.81Hilar                           +------------+--------+--------+----+-----------+--------+--------+----+ +------------------+-----+------------------+-----+ Right Kidney           Left Kidney             +------------------+-----+------------------+-----+ RAR                    RAR                     +------------------+-----+------------------+-----+ RAR (manual)           RAR (manual)            +------------------+-----+------------------+-----+ Cortex                 Cortex                  +------------------+-----+------------------+-----+ Cortex thickness       Corex thickness         +------------------+-----+------------------+-----+ Kidney length (cm)12.90Kidney length (cm)12.50 +------------------+-----+------------------+-----+   Summary: Renal:  Right: Abnormal right Resistive Index. Normal size right kidney. Left:  Abnormal left Resisitve Index. Normal size of left kidney.        Cyst(s) noted.  *See table(s) above for measurements and observations.     Preliminary     EKG: not done   Labs on Admission: I have personally reviewed the available labs and imaging studies at the time of the admission.  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 -Will admit to med surg for further evaluation -IVF -Check urine studies and  US-renal -Follow up renal function by BMP -Avoid ACEI and NSAIDs -Nephrology is consulting -He appears likely to need a renal biopsy soon and may benefit from pulsed steroids -Patient had a renal US today with doppler and resistive index to assess for intrinsic renal disease associated with intrarenal vascular disease; >80 may be associated with poor outcomes with progressive decline in renal function needing to possible HD - his appear to be in the 0.7-0.85 range  HTN -He has chronic HTN but it is generally fairly well controlled -Significantly worsened control with his current renal issues -continue Norvasc  HLD -Continue Lipitor  Asthma -Continue Breo Ellipta       Note: This patient has been tested and is negative for the novel coronavirus COVID-19.  DVT prophylaxis:  Heparin Code Status:  Full - confirmed with patient Family Communication: None present Disposition Plan:  The patient is from: home  Anticipated d/c is to: home without [facility] services   Anticipated d/c date will depend on clinical response to treatment, but likely several days to confirm diagnosis and begin treatment  Patient is currently: acutely ill Consults called: Nephrology; IR Admission status:  Admit - It is my clinical opinion that admission to INPATIENT is reasonable and necessary because of the expectation that this patient will require hospital care that crosses at least 2 midnights to treat this condition based on the medical complexity of the problems presented.  Given the aforementioned information, the predictability of an adverse outcome is felt to be significant.    Karmen Bongo MD [facility]   How to contact the [facility] Attending or Consulting provider [place] or covering provider during after hours [place], for this patient?  1. Check the care team in [facility] and look for a) attending/consulting TRH provider listed and b) the [facility] team listed 2. Log into [email or web address] and use [place]'s  universal password to access. If you do not have the password, please contact the hospital operator. 3. Locate the [facility] provider you are looking for under [facility] and page to a number that you can be directly reached. 4. If you still have difficulty reaching the provider, please page the [facility] (Director on Call) for the Hospitalists listed on amion for assistance.   04/23/2020, 5:35 PM

## 2020-04-23 NOTE — ED Notes (Signed)
Pt transferred to hospital bed for comfort.

## 2020-04-23 NOTE — Consult Note (Signed)
Chief Complaint: Patient was seen in consultation today for AKI/random renal biopsy.  Referring Physician(s): Gean Quint  Supervising Physician: Sandi Mariscal  Patient Status: [name] - ED  History of Present Illness: Paul Donaldson is a 69 y.o. male with a past medical history of hypertension and asthma. Of note, patient was recently in ED approximately 2 weeks ago for generalized weakness and "ill" feeling. He was diagnosed with a UTI and discharged on Keflex. He followed up with PCP and was found to have worsening kidney function. He was referred to nephrology on outpatient basis for management, however they were unable to see him for a month due to patient load. He presented to [facility] ED 04/22/2020 with complaint of abdominal pain. In ED, UA revealed worsening renal function, along with proteinuria and hematuria. He was admitted for further management. Nephrology was consulted who recommended IR consultation for possible random renal biopsy to evaluate for possible causes of AKI.  IR consulted by Dr. Candiss Norse for possible image-guided random renal biopsy. Patient laying in bed resting comfortably. He responds to voice and answers questions appropriately. No complaints. Denies fever, chills, chest pain, dyspnea, abdominal pain, or headache.   Past Medical History:  Diagnosis Date  . Asthma   . Colon polyp    TUBULAR ADENOMA (X1)  . Hypertension     Past Surgical History:  Procedure Laterality Date  . COLONOSCOPY W/ BIOPSIES  2015   TUBULAR ADENOMA (X1).  . CYST EXCISION  1969   back    Allergies: Patient has no known allergies.  Medications: Prior to Admission medications   Medication Sig Start Date End Date Taking? Authorizing Provider  amLODipine (NORVASC) 10 MG tablet Take 10 mg by mouth daily.   Yes [provider]  aspirin 81 MG tablet Take 81 mg by mouth every other day.   Yes [provider]  atorvastatin (LIPITOR) 10 MG tablet Take 10 mg by mouth  daily. 01/29/20  Yes [provider]  BREO ELLIPTA 200-25 MCG/INH AEPB Inhale 1 puff into the lungs daily. 01/31/20  Yes [provider]  ondansetron (ZOFRAN) 4 MG tablet Take 4 mg by mouth every 8 (eight) hours as needed for nausea/vomiting. 04/18/20  Yes [provider]  polyvinyl alcohol (LIQUIFILM TEARS) 1.4 % ophthalmic solution Place 1 drop into both eyes as needed for dry eyes.   Yes [provider]     Family History  Problem Relation Age of Onset  . Colon cancer Neg Hx   . Esophageal cancer Neg Hx   . Rectal cancer Neg Hx   . Stomach cancer Neg Hx     Social History   Socioeconomic History  . Marital status: Single    Spouse name: Not on file  . Number of children: Not on file  . Years of education: Not on file  . Highest education level: Not on file  Occupational History  . Not on file  Tobacco Use  . Smoking status: Former Smoker    Quit date: 09/10/1996    Years since quitting: 23.6  . Smokeless tobacco: Never Used  Substance and Sexual Activity  . Alcohol use: No  . Drug use: No  . Sexual activity: Not on file  Other Topics Concern  . Not on file  Social History Narrative  . Not on file   Social Determinants of Health   Financial Resource Strain:   . Difficulty of Paying Living Expenses:   Food Insecurity:   . Worried About  Running Out of Food in the Last Year:   . [place] in the Last Year:   Transportation Needs:   . Lack of Transportation (Medical):   Mar[place] Kitchen Lack of Transportation (Non-Medical):   Physical Activity:   . Days of Exercise per Week:   . Minutes of Exercise per Session:   Stress:   . Feeling of Stress :   Social Connections:   . Frequency of Communication with Friends and Family:   . Frequency of Social Gatherings with Friends and Family:   . Attends Religious Services:   . Active Member of Clubs or Organizations:   . Attends Archivist Meetings:   Marland Kitchen Marital Status:      Review of  Systems: A 12 point ROS discussed and pertinent positives are indicated in the HPI above.  All other systems are negative.  Review of Systems  Constitutional: Negative for chills and fever.  Respiratory: Negative for shortness of breath and wheezing.   Cardiovascular: Negative for chest pain and palpitations.  Gastrointestinal: Negative for abdominal pain.  Neurological: Negative for headaches.  Psychiatric/Behavioral: Negative for behavioral problems and confusion.    Vital Signs: BP (!) 179/98   Pulse 82   Temp 98.2 F (36.8 C)   Resp 16   Ht 6\' 3"  (1.905 m)   Wt 215 lb (97.5 kg)   SpO2 100%   BMI 26.87 kg/m   Physical Exam Vitals and nursing note reviewed.  Constitutional:      General: He is not in acute distress.    Appearance: Normal appearance.  Cardiovascular:     Rate and Rhythm: Normal rate and regular rhythm.     Heart sounds: Normal heart sounds. No murmur heard.   Pulmonary:     Effort: Pulmonary effort is normal. No respiratory distress.     Breath sounds: Normal breath sounds. No wheezing.  Skin:    General: Skin is warm and dry.  Neurological:     Mental Status: He is alert and oriented to person, place, and time.      MD Evaluation Airway: WNL Heart: WNL Abdomen: WNL Chest/ Lungs: WNL ASA  Classification: 2 Mallampati/Airway Score: Two   Imaging: CT ABDOMEN PELVIS WO CONTRAST  Result Date: 04/23/2020 CLINICAL DATA:  Hematuria.  Significantly worsened renal function. EXAM: CT ABDOMEN AND PELVIS WITHOUT CONTRAST TECHNIQUE: Multidetector CT imaging of the abdomen and pelvis was performed following the standard protocol without IV contrast. COMPARISON:  05/15/2014 CT abdomen report (images not available) FINDINGS: Lower chest: Small right pleural effusion.  Bibasilar atelectasis. Hepatobiliary: An approximately 8.6 cm hypoattenuating mass in the posterior right hepatic lobe likely corresponds to the benign giant hemangioma described on the prior CT.  The gallbladder is unremarkable. There is no biliary dilatation. Pancreas: Unremarkable. Spleen: Unremarkable. Adrenals/Urinary Tract: Unremarkable adrenal glands. Mild-to-moderate symmetric perinephric stranding bilaterally. No renal calculi or hydronephrosis. 5.8 cm low-density lesion arising from the lower pole of the left kidney compatible with a cyst. Nondistended bladder. Stomach/Bowel: The stomach is collapsed. There is no evidence of bowel obstruction or inflammation. The appendix is unremarkable. Vascular/Lymphatic: Mild abdominal aortic atherosclerosis without aneurysm. No enlarged lymph nodes. Reproductive: Mildly enlarged prostate. Other: No intraperitoneal free fluid or free air. Small fat containing umbilical hernia. Musculoskeletal: No acute osseous abnormality or suspicious osseous lesion. Moderate disc degeneration at L4-5 and L5-S1. IMPRESSION: 1. Nonspecific bilateral perinephric stranding. No urinary tract calculi or hydronephrosis. 2. Small right pleural effusion. 3. Known giant hepatic hemangioma. 4. Aortic  Atherosclerosis (ICD10-I70.0). Electronically Signed   By: Logan Bores M.D.   On: 04/23/2020 06:06   VAS US RENAL ARTERY DUPLEX  Result Date: 04/23/2020 ABDOMINAL VISCERAL Indications: Worsening AKI Limitations: Patient unable to hold breath/stay awake during exam. Comparison Study: No prior studies. Performing Technologist: Darlin Coco, RDMS  Examination Guidelines: A complete evaluation includes B-mode imaging, spectral Doppler, color Doppler, and power Doppler as needed of all accessible portions of each vessel. Bilateral testing is considered an integral part of a complete examination. Limited examinations for reoccurring indications may be performed as noted.  Duplex Findings: +----------------------+--------+--------+------+--------+ Mesenteric            PSV cm/sEDV cm/sPlaqueComments +----------------------+--------+--------+------+--------+ Aorta Mid               106       24                  +----------------------+--------+--------+------+--------+ Celiac Artery Origin    146      38                  +----------------------+--------+--------+------+--------+ Celiac Artery Proximal  173      48                  +----------------------+--------+--------+------+--------+ SMA Origin              194      20                  +----------------------+--------+--------+------+--------+    +------------------+--------+--------+-------+ Right Renal ArteryPSV cm/sEDV cm/sComment +------------------+--------+--------+-------+ Origin               75      16           +------------------+--------+--------+-------+ Proximal             99      20           +------------------+--------+--------+-------+ Mid                 107      21           +------------------+--------+--------+-------+ Distal               66      15           +------------------+--------+--------+-------+ +-----------------+--------+--------+-------+ Left Renal ArteryPSV cm/sEDV cm/sComment +-----------------+--------+--------+-------+ Origin              81      15           +-----------------+--------+--------+-------+ Proximal            82      15           +-----------------+--------+--------+-------+ Mid                 81      15           +-----------------+--------+--------+-------+ Distal              91      18           +-----------------+--------+--------+-------+ +------------+--------+--------+----+-----------+--------+--------+----+ Right KidneyPSV cm/sEDV cm/sRI  Left KidneyPSV cm/sEDV cm/sRI   +------------+--------+--------+----+-----------+--------+--------+----+ Upper Pole  58      13      0.78Upper Pole 63      12      0.80 +------------+--------+--------+----+-----------+--------+--------+----+ Mid         52      10  0.81Mid        46      9       0.81  +------------+--------+--------+----+-----------+--------+--------+----+ Lower Pole  64      16      0.75Lower Pole 32      10      0.68 +------------+--------+--------+----+-----------+--------+--------+----+ Hilar       59      11      0.81Hilar                           +------------+--------+--------+----+-----------+--------+--------+----+ +------------------+-----+------------------+-----+ Right Kidney           Left Kidney             +------------------+-----+------------------+-----+ RAR                    RAR                     +------------------+-----+------------------+-----+ RAR (manual)           RAR (manual)            +------------------+-----+------------------+-----+ Cortex                 Cortex                  +------------------+-----+------------------+-----+ Cortex thickness       Corex thickness         +------------------+-----+------------------+-----+ Kidney length (cm)12.90Kidney length (cm)12.50 +------------------+-----+------------------+-----+   Summary: Renal:  Right: Abnormal right Resistive Index. Normal size right kidney. Left:  Abnormal left Resisitve Index. Normal size of left kidney.        Cyst(s) noted.  *See table(s) above for measurements and observations.     Preliminary     Labs:  CBC: Recent Labs    04/08/20 1123 04/22/20 1836  WBC 7.8 6.4  HGB 13.4 11.4*  HCT 41.5 36.5*  PLT 203 167    COAGS: No results for input(s): INR, APTT in the last 8760 hours.  BMP: Recent Labs    04/08/20 1123 04/22/20 1836  NA 140 141  K 4.7 3.3*  CL 105 107  CO2 27 23  GLUCOSE 95 88  BUN 50* 50*  CALCIUM 8.5* 8.3*  CREATININE 3.72* 4.74*  GFRNONAA 15* 11*  GFRAA 18* 13*    LIVER FUNCTION TESTS: Recent Labs    04/22/20 1836  BILITOT 1.1  AST 22  ALT 22  ALKPHOS 70  PROT 7.7  ALBUMIN 2.7*     Assessment and Plan:  AKI, proteinuria, hematuria. Plan for image-guided random renal biopsy  tentatively for tomorrow in IR. Patient will be NPO at midnight. Afebrile. Will hold heparin per IR protocol. INR pending.  Risks and benefits discussed with the patient including, but not limited to bleeding, infection, damage to adjacent structures or low yield requiring additional tests. All of the patient's questions were answered, patient is agreeable to proceed. Consent was not obtained today, patient wishes to sign consent tomorrow prior to procedure (states "I've been through too much today)- will obtain consent tomorrow prior to procedure.   Thank you for this interesting consult.  I greatly enjoyed meeting DECOREY WAHLERT and look forward to participating in their care.  A copy of this report was sent to the requesting provider on this date.  Electronically Signed: Earley Abide, PA-C 04/23/2020, 3:50 PM   I spent a total of 40 Minutes in face to face in  clinical consultation, greater than 50% of which was counseling/coordinating care for AKI/random renal biopsy.

## 2020-04-23 NOTE — ED Notes (Signed)
Pt transported to vascular.  °

## 2020-04-23 NOTE — Progress Notes (Signed)
Renal artery duplex study completed.   See Cv Proc for preliminary results.   Anaissa Macfadden  

## 2020-04-23 NOTE — ED Provider Notes (Signed)
Call for admission worsening AKI. Physical Exam  BP (!) 167/84 (BP Location: Right Arm)   Pulse 88   Temp 99 F (37.2 C) (Oral)   Resp 14   Ht 6\' 3"  (1.905 m)   Wt 97.5 kg   SpO2 93%   BMI 26.87 kg/m   Physical Exam  ED Course/Procedures     Procedures  MDM         Charlesetta Shanks, MD 04/23/20 [phone]

## 2020-04-23 NOTE — Consult Note (Addendum)
Paul Donaldson Admit Date: 04/22/2020 04/23/2020 Paul Donaldson Requesting Physician:  Karmen Bongo  Reason for Consult:  Paul Donaldson HPI:  69 year old male past medical history of hypertension, hyperlipidemia, asthma who was sent to the ER for worsening renal failure.  He is open to see [facility]. [facility] in about a month.  Is also been having generalized malaise along with nausea.  He has been recently treated for UTI but a couple weeks ago with Keflex.  He does report that his urine is brown and muddy.  He does report longstanding rash on his palms as well as his face and forehead. Denies any over-the-counter medication use or any herbal supplements.  Denies a family history of any kidney disease He denies any epistaxis, hemoptysis, fevers, chest pain, shortness of breath, blisters, tinnitus, headaches, changes in vision, neck pain, bloody bowel movements.  He denies any history of any autoimmune conditions.  PMH Incudes: Past Medical History:  Diagnosis Date  . Asthma   . Colon polyp    TUBULAR ADENOMA (X1)  . Hypertension        Creatinine, Ser (mg/dL)  Date Value  04/22/2020 4.74 (H)  04/08/2020 3.72 (H)  ] I/Os:  ROS Balance of 12 systems is negative w/ exceptions as above  PMH  Past Medical History:  Diagnosis Date  . Asthma   . Colon polyp    TUBULAR ADENOMA (X1)  . Hypertension    PSH  Past Surgical History:  Procedure Laterality Date  . COLONOSCOPY W/ BIOPSIES  2015   TUBULAR ADENOMA (X1).  . CYST EXCISION  1969   back   FH  Family History  Problem Relation Age of Onset  . Colon cancer Neg Hx   . Esophageal cancer Neg Hx   . Rectal cancer Neg Hx   . Stomach cancer Neg Hx   Negative family history for kidney disease  SH  reports that he quit smoking about 23 years ago. He has never used smokeless tobacco. He reports that he does not drink alcohol and does not use drugs. Allergies No Known Allergies Home medications Prior to Admission  medications   Medication Sig Start Date End Date Taking? Authorizing Provider  amLODipine (NORVASC) 10 MG tablet Take 10 mg by mouth daily.   Yes [provider]  aspirin 81 MG tablet Take 81 mg by mouth every other day.   Yes [provider]  atorvastatin (LIPITOR) 10 MG tablet Take 10 mg by mouth daily. 01/29/20  Yes [provider]  BREO ELLIPTA 200-25 MCG/INH AEPB Inhale 1 puff into the lungs daily. 01/31/20  Yes [provider]  ondansetron (ZOFRAN) 4 MG tablet Take 4 mg by mouth every 8 (eight) hours as needed for nausea/vomiting. 04/18/20  Yes [provider]  polyvinyl alcohol (LIQUIFILM TEARS) 1.4 % ophthalmic solution Place 1 drop into both eyes as needed for dry eyes.   Yes [provider]    Current Medications Scheduled Meds: . amLODipine  10 mg Oral Daily  . aspirin EC  81 mg Oral QODAY  . atorvastatin  10 mg Oral Daily  . fluticasone furoate-vilanterol  1 puff Inhalation Daily  . heparin  5,000 Units Subcutaneous Q8H   Continuous Infusions: . lactated ringers     PRN Meds:.acetaminophen **OR** acetaminophen, calcium carbonate (dosed in mg elemental calcium), camphor-menthol **AND** hydrOXYzine, docusate sodium, feeding supplement (NEPRO CARB STEADY), ondansetron **OR** ondansetron (ZOFRAN) IV, sorbitol, zolpidem  CBC Recent Labs  Lab 04/22/20 1836  WBC  6.4  NEUTROABS 3.7  HGB 11.4*  HCT 36.5*  MCV 95.8  PLT 527   Basic Metabolic Panel Recent Labs  Lab 04/22/20 1836  NA 141  K 3.3*  CL 107  CO2 23  GLUCOSE 88  BUN 50*  CREATININE 4.74*  CALCIUM 8.3*    Physical Exam  Blood pressure (!) 186/91, pulse 92, temperature 99 F (37.2 C), temperature source Oral, resp. rate 14, height 6\' 3"  (1.905 m), weight 97.5 kg, SpO2 97 %. GEN: wdwn, sitting in bed, nad ENT: no nasal discharge, mmm, no nasal crusting superficially, no oral ulcers EYES: no scleral icterus, eomi CV: normal rate, no murmurs PULM: no iwob,  bilateral chest rise ABD: NABS, non-distended SKIN: dark lesions on palmar aspect of hands and on bottom of feet EXT: no edema, warm and well perfused Neuro: Oriented x3, speech clear and coherent, gait normal/ambulatory, moves all tremors spontaneously   Assessment 1.  Acute kidney injury etiology currently unclear urine sediment does reveal large amounts of blood and has a urine protein creatinine ratio of 8.23 g.  I am concerned for possible autoimmune process for example vasculitis/GN (possibly AIN? See below for my urine sediment examination). Seems like his baseline cr is around 1.3 per careeverywhere in epic. 2.  Nephrotic range proteinuria (8 g) 3.,  Microscopic hematuria 4.  History of hypertension  I personally examined the urine sediment down in the lab on 04/23/2020.  Findings revealed numerous (too many to count) RBCs.  There were very rare acanthocytes/dysmorphic RBCs (less than 1 per high-power field), numerous RTEs, numerous WBC's, no discernible RBC casts, few hyaline no granular casts with degenerated cells.  Interestingly, there were some casts with WBCs present within.  Few oval fat bodies  Plan 1. Plan for native  Kidney biopsy given rapid rise in cr and nephrotic range proteinuria without a definitive cause, keep npo after midnight, hold heparin products.  IR consulted 2. No current indication for renal replacement therapy 3. We will hold off on stress dose steroids for now, this will be pending his labs tomorrow 4. ANCA serologies and ANA pending 5. Will check c3,c4, anti ds dna ab's 6. Will check hepatitis serologies, HIV 7. Check CK 8. Increased RI's on renal artery duplex on prelim read, awaiting final read 9. Utilize beta-blockers for blood pressure control, would hold off on using hydralazine 10. Avoid nephrotoxic medications including NSAIDs and iodinated intravenous contrast exposure unless the latter is absolutely indicated.  Preferred narcotic agents for pain  control are hydromorphone, fentanyl, and methadone. Morphine should not be used. Avoid Baclofen and avoid oral sodium phosphate and magnesium citrate based laxatives / bowel preps. Continue strict Input and Output monitoring. Will monitor the patient closely with you and intervene or adjust therapy as indicated by changes in clinical status/labs   Plans communicated to the primary team  Paul Quint, MD [place] 04/23/2020, 2:43 PM

## 2020-04-23 NOTE — ED Provider Notes (Signed)
[facility] EMERGENCY DEPARTMENT Provider Note   CSN: [id number] Arrival date & time: 04/22/20  1731   History Chief Complaint  Patient presents with  . Abdominal Pain    Paul Donaldson is a 69 y.o. male.  The history is provided by the patient.  Abdominal Pain He has history of hypertension and comes in because of generalized malaise.  He has had nausea and anorexia and generalized weakness.  Symptoms started about 2 weeks ago when he was seen in emergency for a urinary tract infection and elevated creatinine.  Since then, he followed up with his PCP and creatinine has risen further.  He was to be referred to nephrology, but they could not see him until next month.  He has been taking a medication for nausea which has not been helping.  He is not sure what the medication is.  He denies fever or chills.  He denies vomiting.  He denies diarrhea.  He denies any urinary urgency, frequency, tenesmus, dysuria.  Past Medical History:  Diagnosis Date  . Asthma   . Colon polyp    TUBULAR ADENOMA (X1)  . Hypertension     Patient Active Problem List   Diagnosis Date Noted  . Rectal bleeding 04/14/2014  . Nausea alone 04/12/2014  . LUQ pain 04/12/2014    Past Surgical History:  Procedure Laterality Date  . COLONOSCOPY W/ BIOPSIES  2015   TUBULAR ADENOMA (X1).  . CYST EXCISION  1969   back       Family History  Problem Relation Age of Onset  . Colon cancer Neg Hx   . Esophageal cancer Neg Hx   . Rectal cancer Neg Hx   . Stomach cancer Neg Hx     Social History   Tobacco Use  . Smoking status: Former Smoker    Quit date: 09/10/1996    Years since quitting: 23.6  . Smokeless tobacco: Never Used  Substance Use Topics  . Alcohol use: No  . Drug use: No    Home Medications Prior to Admission medications   Medication Sig Start Date End Date Taking? Authorizing Provider  amLODipine (NORVASC) 10 MG tablet Take 10 mg by mouth daily.    [provider]  aspirin 81 MG tablet Take 81 mg by mouth every other day.    [provider]  atorvastatin (LIPITOR) 10 MG tablet Take 10 mg by mouth daily. 01/29/20   [provider]  BREO ELLIPTA 200-25 MCG/INH AEPB Inhale 1 puff into the lungs daily. 01/31/20   [provider]  cephALEXin (KEFLEX) 500 MG capsule Take 1 capsule (500 mg total) by mouth 4 (four) times daily. 04/08/20   Daleen Bo, MD  Fluticasone-Salmeterol (ADVAIR HFA IN) Inhale into the lungs as needed.    [provider]  Zn-Pyg Afri-Nettle-Saw Palmet (SAW PALMETTO COMPLEX PO) Take by mouth. 250 mg every other day    [provider]    Allergies    Patient has no known allergies.  Review of Systems   Review of Systems  Gastrointestinal: Positive for abdominal pain.  All other systems reviewed and are negative.   Physical Exam Updated Vital Signs BP (!) 167/84 (BP Location: Right Arm)   Pulse 88   Temp 99 F (37.2 C) (Oral)   Resp 14   Ht 6\' 3"  (1.905 m)   Wt 97.5 kg   SpO2 93%   BMI 26.87 kg/m   Physical Exam Vitals and nursing note reviewed.  69 year old male, resting comfortably and in no acute distress. Vital signs are significant for elevated blood pressure. Oxygen saturation is 93%, which is normal. Head is normocephalic and atraumatic. PERRLA, EOMI. Oropharynx is clear. Neck is nontender and supple without adenopathy or JVD. Back is nontender and there is no CVA tenderness. Lungs are clear without rales, wheezes, or rhonchi. Chest is nontender. Heart has regular rate and rhythm without murmur. Abdomen is soft, flat, nontender without masses or hepatosplenomegaly and peristalsis is normoactive. Extremities have 1+ edema, full range of motion is present. Skin is warm and dry without rash. Neurologic: Mental status is normal, cranial nerves are intact, there are no motor or sensory deficits.  ED Results / Procedures / Treatments   Labs (all labs  ordered are listed, but only abnormal results are displayed) Labs Reviewed  CBC WITH DIFFERENTIAL/PLATELET - Abnormal; Notable for the following components:      Result Value   RBC 3.81 (*)    Hemoglobin 11.4 (*)    HCT 36.5 (*)    All other components within normal limits  COMPREHENSIVE METABOLIC PANEL - Abnormal; Notable for the following components:   Potassium 3.3 (*)    BUN 50 (*)    Creatinine, Ser 4.74 (*)    Calcium 8.3 (*)    Albumin 2.7 (*)    GFR calc non Af Amer 11 (*)    GFR calc Af Amer 13 (*)    All other components within normal limits  URINALYSIS, ROUTINE W REFLEX MICROSCOPIC - Abnormal; Notable for the following components:   APPearance HAZY (*)    Glucose, UA 50 (*)    Hgb urine dipstick LARGE (*)    Protein, ur >=300 (*)    Leukocytes,Ua SMALL (*)    RBC / HPF >50 (*)    Bacteria, UA FEW (*)    All other components within normal limits  SARS CORONAVIRUS 2 BY RT PCR (HOSPITAL ORDER, [facility])   Procedures Procedures   Medications Ordered in ED Medications  prochlorperazine (COMPAZINE) injection 10 mg (has no administration in time range)    ED Course  I have reviewed the triage vital signs and the nursing notes.  Pertinent lab results that were available during my care of the patient were reviewed by me and considered in my medical decision making (see chart for details).  MDM Rules/Calculators/A&P Generalized malaise in the setting of acute kidney failure.  Symptoms are probably related to renal failure.  Old records are reviewed confirming ED visit with renal failure and probable UTI 2 weeks ago, follow-up with PCP showed urine culture with no growth but continuing to worsen renal function.  Urinalysis today does have 11-20 WBCs but few bacteria, greater than 50 RBCs.  Hemoglobin has dropped 2 g, probably related to kidney disease.  Creatinine today is 4.74 compared with 3.72 in July.  Per care everywhere, creatinine was 1.31  on September 05, 2019.  He is not on any nephrotoxic medications.  Of note, he has received 1 dose of Covid vaccine.  He will need to be admitted for further evaluation of his acute renal failure.  Case is discussed with Dr. Lorin Mercy of [facility], who agrees to admit the patient.  Final Clinical Impression(s) / ED Diagnoses Final diagnoses:  Acute kidney injury (nontraumatic) (HCC)  Normochromic normocytic anemia    Rx / DC Orders ED Discharge Orders    None       Delora Fuel, MD  04/23/20 0729  

## 2020-04-24 ENCOUNTER — Inpatient Hospital Stay (HOSPITAL_COMMUNITY): Payer: Medicare Other

## 2020-04-24 DIAGNOSIS — E785 Hyperlipidemia, unspecified: Secondary | ICD-10-CM | POA: Diagnosis not present

## 2020-04-24 DIAGNOSIS — I10 Essential (primary) hypertension: Secondary | ICD-10-CM | POA: Diagnosis not present

## 2020-04-24 DIAGNOSIS — N179 Acute kidney failure, unspecified: Secondary | ICD-10-CM | POA: Diagnosis not present

## 2020-04-24 LAB — BASIC METABOLIC PANEL
Anion gap: 7 (ref 5–15)
BUN: 46 mg/dL — ABNORMAL HIGH (ref 8–23)
CO2: 27 mmol/L (ref 22–32)
Calcium: 8.1 mg/dL — ABNORMAL LOW (ref 8.9–10.3)
Chloride: 105 mmol/L (ref 98–111)
Creatinine, Ser: 4.73 mg/dL — ABNORMAL HIGH (ref 0.61–1.24)
GFR calc Af Amer: 13 mL/min — ABNORMAL LOW (ref >60)
GFR calc non Af Amer: 12 mL/min — ABNORMAL LOW (ref >60)
Glucose, Bld: 95 mg/dL (ref 70–99)
Potassium: 3.1 mmol/L — ABNORMAL LOW (ref 3.5–5.1)
Sodium: 139 mmol/L (ref 135–145)

## 2020-04-24 LAB — CBC
HCT: 31.8 % — ABNORMAL LOW (ref 39.0–52.0)
Hemoglobin: 10.5 g/dL — ABNORMAL LOW (ref 13.0–17.0)
MCH: 30.2 pg (ref 26.0–34.0)
MCHC: 33 g/dL (ref 30.0–36.0)
MCV: 91.4 fL (ref 80.0–100.0)
Platelets: 127 10*3/uL — ABNORMAL LOW (ref 150–400)
RBC: 3.48 MIL/uL — ABNORMAL LOW (ref 4.22–5.81)
RDW: 13 % (ref 11.5–15.5)
WBC: 7.5 10*3/uL (ref 4.0–10.5)
nRBC: 0 % (ref 0.0–0.2)

## 2020-04-24 LAB — ANA W/REFLEX IF POSITIVE: Anti Nuclear Antibody (ANA): NEGATIVE

## 2020-04-24 LAB — MPO/PR-3 (ANCA) ANTIBODIES
ANCA Proteinase 3: <3.5 U/mL (ref 0.0–3.5)
Myeloperoxidase Abs: <9 U/mL (ref 0.0–9.0)

## 2020-04-24 LAB — HEPATITIS B SURFACE ANTIGEN: Hepatitis B Surface Ag: NONREACTIVE

## 2020-04-24 LAB — ANCA TITERS
Atypical P-ANCA titer: <1:20 {titer}
C-ANCA: <1:20 {titer}
P-ANCA: <1:20 {titer}

## 2020-04-24 LAB — PROTIME-INR
INR: 1.1 (ref 0.8–1.2)
Prothrombin Time: 13.8 seconds (ref 11.4–15.2)

## 2020-04-24 LAB — HEPATITIS B CORE ANTIBODY, TOTAL: Hep B Core Total Ab: NONREACTIVE

## 2020-04-24 LAB — HEPATITIS C ANTIBODY: HCV Ab: NONREACTIVE

## 2020-04-24 MED ORDER — HYDRALAZINE HCL 20 MG/ML IJ SOLN: 10.0000 mg | Freq: Once | INTRAMUSCULAR | Status: AC

## 2020-04-24 MED ORDER — GELATIN ABSORBABLE 12-7 MM EX MISC
CUTANEOUS | Status: AC
  Filled 2020-04-24: qty 1

## 2020-04-24 MED ORDER — POTASSIUM CHLORIDE CRYS ER 20 MEQ PO TBCR: 40.0000 meq | EXTENDED_RELEASE_TABLET | Freq: Once | ORAL | Status: DC

## 2020-04-24 MED ORDER — HYDRALAZINE HCL 20 MG/ML IJ SOLN
INTRAMUSCULAR | Status: AC
  Administered 2020-04-24: 10 mg via INTRAVENOUS
  Filled 2020-04-24: qty 1

## 2020-04-24 MED ORDER — MIDAZOLAM HCL 2 MG/2ML IJ SOLN
INTRAMUSCULAR | Status: AC | PRN
  Administered 2020-04-24: 1 mg via INTRAVENOUS

## 2020-04-24 MED ORDER — MIDAZOLAM HCL 2 MG/2ML IJ SOLN
INTRAMUSCULAR | Status: AC
  Filled 2020-04-24: qty 2

## 2020-04-24 MED ORDER — FENTANYL CITRATE (PF) 100 MCG/2ML IJ SOLN
INTRAMUSCULAR | Status: AC | PRN
  Administered 2020-04-24: 50 ug via INTRAVENOUS

## 2020-04-24 MED ORDER — HYDRALAZINE HCL 20 MG/ML IJ SOLN
INTRAMUSCULAR | Status: AC | PRN
  Administered 2020-04-24: 10 mg via INTRAVENOUS

## 2020-04-24 MED ORDER — CARVEDILOL 12.5 MG PO TABS
12.5000 mg | ORAL_TABLET | Freq: Two times a day (BID) | ORAL | Status: DC
  Administered 2020-04-25 – 2020-04-29 (×9): 12.5 mg via ORAL
  Filled 2020-04-24 (×9): qty 1

## 2020-04-24 MED ORDER — FENTANYL CITRATE (PF) 100 MCG/2ML IJ SOLN
INTRAMUSCULAR | Status: AC
  Filled 2020-04-24: qty 2

## 2020-04-24 MED ORDER — LIDOCAINE HCL (PF) 1 % IJ SOLN
INTRAMUSCULAR | Status: AC
  Filled 2020-04-24: qty 30

## 2020-04-24 MED ORDER — ENOXAPARIN SODIUM 30 MG/0.3ML ~~LOC~~ SOLN
30.0000 mg | SUBCUTANEOUS | Status: DC
  Administered 2020-04-25 – 2020-04-29 (×5): 30 mg via SUBCUTANEOUS
  Filled 2020-04-24 (×5): qty 0.3

## 2020-04-24 NOTE — Progress Notes (Signed)
PROGRESS NOTE  Paul Donaldson UDJ:[id number] DOB: 1951/04/26 DOA: 04/22/2020 PCP: Bartholome Bill, MD  HPI/Recap of past 24 hours: HPI from Dr Jacquenette Shone is a 69 y.o. male with medical history significant of HTN presenting with AKI. He reports that he has been feeling weak and anorexic for several weeks.  He was initially seen on 7/6 at the [facility] ER for similar symptoms; he was diagnosed with a UTI and given Keflex.  He took the Keflex but has not had improvement in his symptoms and has noticed worsening BP elevation as well as brownish colored urine.  He went to his PCP, who attempted to arrange for nephrology evaluation and was ultimately told to come to the ER. In the ED, Cr 1.7 -> 3.7. Nephrology consulted. Pt admitted for further management.    Today, saw patient after renal biopsy, denies any new complaints.  Denies any chest pain, abdominal pain, nausea/vomiting, fever/chills.  Assessment/Plan: Principal Problem:   AKI (acute kidney injury) ([place]) Active Problems:   Hypertension   Dyslipidemia   AKI/nephrotic range proteinuria Baseline creatinine around 1.3 Unknown etiology, suspecting possible vasculitis/GN UA reveals large amount of blood, urine protein creatinine ratio of 8.23 g Lipid panel pending P-ANCA, C-ANCA, ANA all negative Status post kidney biopsy on 04/24/2020 Nephrology on board, appreciate recs Continue IV fluids Daily BMP  Hypertension BP stable Continue Norvasc, started on Coreg 12.5 mg twice daily by nephrology  Hypokalemia Replace as needed  Normocytic anemia Likely worsened by IV fluid/hemodilution Anemia panel pending Daily CBC  Hyperlipidemia Continue Lipitor  Asthma Continue inhaler         Malnutrition Type:      Malnutrition Characteristics:      Nutrition Interventions:       Estimated body mass index is 26.84 kg/m as calculated from the following:   Height as of this encounter: 6\' 3"  (1.905 m).    Weight as of this encounter: 97.4 kg.     Code Status: Full  Family Communication: Discussed with patient  Disposition Plan: Status is: Inpatient  Remains inpatient appropriate because:Inpatient level of care appropriate due to severity of illness   Dispo: The patient is from: Home              Anticipated d/c is to: Home              Anticipated d/c date is: 2 days              Patient currently is not medically stable to d/c.     Consultants:  Nephrology  Procedures:  Renal biopsy on 04/24/2020  Antimicrobials:  None  DVT prophylaxis: Lovenox   Objective: Vitals:   04/24/20 1207 04/24/20 1215 04/24/20 1245 04/24/20 1523  BP: (!) 177/82 (!) 170/89 (!) 164/89 (!) 154/95  Pulse:  87 90 92  Resp:  17 16 20   Temp:  97.8 F (36.6 C)  98.1 F (36.7 C)  TempSrc:  Oral  Oral  SpO2:  97% 97% 96%  Weight:      Height:        Intake/Output Summary (Last 24 hours) at 04/24/2020 1543 Last data filed at 04/24/2020 0930 Gross per 24 hour  Intake 1451.86 ml  Output 950 ml  Net 501.86 ml   Filed Weights   04/22/20 1801 04/23/20 0830 04/23/20 2117  Weight: 97.5 kg 97.5 kg 97.4 kg    Exam:  General: NAD   Cardiovascular: S1, S2 present  Respiratory: CTAB  Abdomen: Soft, nontender, nondistended, bowel sounds present  Musculoskeletal: Trace bilateral pedal edema noted  Skin: Normal  Psychiatry: Normal mood    Data Reviewed: CBC: Recent Labs  Lab 04/22/20 1836 04/24/20 0252  WBC 6.4 7.5  NEUTROABS 3.7  --   HGB 11.4* 10.5*  HCT 36.5* 31.8*  MCV 95.8 91.4  PLT 167 588*   Basic Metabolic Panel: Recent Labs  Lab 04/22/20 1836 04/24/20 0252  NA 141 139  K 3.3* 3.1*  CL 107 105  CO2 23 27  GLUCOSE 88 95  BUN 50* 46*  CREATININE 4.74* 4.73*  CALCIUM 8.3* 8.1*   GFR: Estimated Creatinine Clearance: 17.1 mL/min (A) (by C-G formula based on SCr of 4.73 mg/dL (H)). Liver Function Tests: Recent Labs  Lab 04/22/20 1836  AST 22  ALT 22   ALKPHOS 70  BILITOT 1.1  PROT 7.7  ALBUMIN 2.7*   No results for input(s): LIPASE, AMYLASE in the last 168 hours. No results for input(s): AMMONIA in the last 168 hours. Coagulation Profile: Recent Labs  Lab 04/24/20 0252  INR 1.1   Cardiac Enzymes: No results for input(s): CKTOTAL, CKMB, CKMBINDEX, TROPONINI in the last 168 hours. BNP (last 3 results) No results for input(s): PROBNP in the last 8760 hours. HbA1C: No results for input(s): HGBA1C in the last 72 hours. CBG: No results for input(s): GLUCAP in the last 168 hours. Lipid Profile: No results for input(s): CHOL, HDL, LDLCALC, TRIG, CHOLHDL, LDLDIRECT in the last 72 hours. Thyroid Function Tests: No results for input(s): TSH, T4TOTAL, FREET4, T3FREE, THYROIDAB in the last 72 hours. Anemia Panel: No results for input(s): VITAMINB12, FOLATE, FERRITIN, TIBC, IRON, RETICCTPCT in the last 72 hours. Urine analysis:    Component Value Date/Time   COLORURINE YELLOW 04/22/2020 1830   APPEARANCEUR HAZY (A) 04/22/2020 1830   LABSPEC 1.011 04/22/2020 1830   PHURINE 6.0 04/22/2020 1830   GLUCOSEU 50 (A) 04/22/2020 1830   HGBUR LARGE (A) 04/22/2020 1830   BILIRUBINUR NEGATIVE 04/22/2020 1830   KETONESUR NEGATIVE 04/22/2020 1830   PROTEINUR >=300 (A) 04/22/2020 1830   NITRITE NEGATIVE 04/22/2020 1830   LEUKOCYTESUR SMALL (A) 04/22/2020 1830   Sepsis Labs: @LABRCNTIP (procalcitonin:4,lacticidven:4)  ) Recent Results (from the past 240 hour(s))  SARS Coronavirus 2 by RT PCR (hospital order, performed in [facility] hospital lab) Nasopharyngeal Nasopharyngeal Swab     Status: None   Collection Time: 04/23/20  7:15 AM   Specimen: Nasopharyngeal Swab  Result Value Ref Range Status   SARS Coronavirus 2 NEGATIVE NEGATIVE Final    Comment: (NOTE) SARS-CoV-2 target nucleic acids are NOT DETECTED.  The SARS-CoV-2 RNA is generally detectable in upper and lower respiratory specimens during the acute phase of infection. The  lowest concentration of SARS-CoV-2 viral copies this assay can detect is 250 copies / mL. A negative result does not preclude SARS-CoV-2 infection and should not be used as the sole basis for treatment or other patient management decisions.  A negative result may occur with improper specimen collection / handling, submission of specimen other than nasopharyngeal swab, presence of viral mutation(s) within the areas targeted by this assay, and inadequate number of viral copies (<250 copies / mL). A negative result must be combined with clinical observations, patient history, and epidemiological information.  Fact Sheet for Patients:   StrictlyIdeas.no  Fact Sheet for Healthcare Providers: BankingDealers.co.za  This test is not yet approved or  cleared by the [place] FDA and has been authorized for detection and/or diagnosis of  SARS-CoV-2 by FDA under an Emergency Use Authorization (EUA).  This EUA will remain in effect (meaning this test can be used) for the duration of the COVID-19 declaration under Section 564(b)(1) of the Act, 21 U.S.C. section 360bbb-3(b)(1), unless the authorization is terminated or revoked sooner.  Performed at [facility], [place] [address], [place], [place] [address]   MRSA PCR Screening     Status: None   Collection Time: 04/23/20  9:31 PM   Specimen: Nasopharyngeal  Result Value Ref Range Status   MRSA by PCR NEGATIVE NEGATIVE Final    Comment:        The GeneXpert MRSA Assay (FDA approved for NASAL specimens only), is one component of a comprehensive MRSA colonization surveillance program. It is not intended to diagnose MRSA infection nor to guide or monitor treatment for MRSA infections. Performed at [facility], [place] [address] [place], [place] [address]       Studies: US BIOPSY (KIDNEY)  Result Date: 04/24/2020 INDICATION: 69 year old male with a history of chronic renal  disease, proteinuria, referred for biopsy EXAM: IMAGE GUIDED BIOPSY LEFT KIDNEY FOR MEDICAL RENAL PURPOSE MEDICATIONS: None. ANESTHESIA/SEDATION: Moderate (conscious) sedation was employed during this procedure. A total of Versed 1.0 mg and Fentanyl 50 mcg was administered intravenously. Moderate Sedation Time: 11 minutes. The patient's level of consciousness and vital signs were monitored continuously by radiology nursing throughout the procedure under my direct supervision. FLUOROSCOPY TIME:  None COMPLICATIONS: None PROCEDURE: Informed written consent was obtained from the patient after a thorough discussion of the procedural risks, benefits and alternatives. All questions were addressed. Maximal Sterile Barrier Technique was utilized including caps, mask, sterile gowns, sterile gloves, sterile drape, hand hygiene and skin antiseptic. A timeout was performed prior to the initiation of the procedure. Patient was positioned prone position on the gantry table. Images were stored sent to PACs. Once the patient is prepped and draped in the usual sterile fashion, the skin and subcutaneous tissues overlying the left kidney were generously infiltrated 1% lidocaine for local anesthesia. Using ultrasound guidance, a 15 gauge guide needle was advanced into the lower lateral cortex of the left kidney. Once we confirmed location of the needle tip, 2 separate 16 gauge core biopsy were achieved. Two Gel-Foam pledgets were infused with a small amount of saline. The needle was removed. Final images were stored. The patient tolerated the procedure well and remained hemodynamically stable throughout. No complications were encountered and no significant blood loss encountered. IMPRESSION: Status post image guided medical renal biopsy. Signed, Dulcy Fanny. Dellia Nims, RPVI [facility] Radiology Electronically Signed   By: Corrie Mckusick D.O.   On: 04/24/2020 09:38    Scheduled Meds: .  amLODipine  10 mg Oral Daily  . aspirin EC  81 mg Oral QODAY  . atorvastatin  10 mg Oral Daily  . [START ON 04/25/2020] enoxaparin (LOVENOX) injection  30 mg Subcutaneous Q24H  . fluticasone furoate-vilanterol  1 puff Inhalation Daily  . gelatin adsorbable      . lidocaine (PF)        Continuous Infusions: . lactated ringers 125 mL/hr at 04/24/20 1220     LOS: 1 day     Alma Friendly, MD [facility]  If 7PM-7AM, please contact night-coverage [email or web address] 04/24/2020, 3:44 PM

## 2020-04-24 NOTE — Progress Notes (Signed)
Pt back to unit; RN received call pt need to use bathroom; NT went into room pt already up ambulating towards bathroom. RN arrived in room re-educated patient need to remain in bed lying flat for 3 hours. Per Radiology report Okay for patient to use urinal; Pt redirected back to bed; given urinal RN and NT stepped out of room; Pt stood back up and used urinal; RN re-educated patient needs to sit on side of bed when using urinal needs to lie flat otherwise. RN will continue to monitor.

## 2020-04-24 NOTE — Progress Notes (Signed)
[facility] Progress Note    Assessment/ Plan:   1. Acute kidney Injury, etiology currently unclear urine sediment does reveal large amounts of blood and has a urine protein creatinine ratio of 8.23 g.  I am concerned for possible autoimmune process for example vasculitis/GN (possibly AIN? See below for my urine sediment examination). Seems like his baseline cr is around 1.3 per careeverywhere in epic. -s/p native kidney biopsy today 04/24/2020, should get results by tomorrow, will hold off on empiric treatment for now given stability of his kidney function at this junction -renal art duplex reviewed: RI's not significantly elevated (esp with normal sized kidneys) -check CK levels -Avoid nephrotoxic medications including NSAIDs and iodinated intravenous contrast exposure unless the latter is absolutely indicated.  Preferred narcotic agents for pain control are hydromorphone, fentanyl, and methadone. Morphine should not be used. Avoid Baclofen and avoid oral sodium phosphate and magnesium citrate based laxatives / bowel preps. Continue strict Input and Output monitoring. Will monitor the patient closely with you and intervene or adjust therapy as indicated by changes in clinical status/labs  2. Nephrotic range proteinuria, nephrotic syndrome -check lipid panel -volume status acceptable (suprisingly not significantly volume up) -questionable if needs prophylactic anticoagulation (typically applies for membranous nephropathy) especially as his albumin 2.7 3. Hypokalemia, replete prn but would be cautious on how much to give, would need to repeat K levels to ensure appropriate response 4. HTN -agree with norvasc, start coreg 12.5mg  bid -avoid hydralazine please -ideal goal <130/80  Subjective:   Tolerated her biopsy, feels well. No complaints.   Objective:   BP (!) 164/89 (BP Location: Right Arm)   Pulse 90   Temp 97.8 F (36.6 C) (Oral)   Resp 16   Ht 6\' 3"  (1.905 m)   Wt  97.4 kg   SpO2 97%   BMI 26.84 kg/m   Intake/Output Summary (Last 24 hours) at 04/24/2020 1311 Last data filed at 04/24/2020 0930 Gross per 24 hour  Intake 1451.86 ml  Output 950 ml  Net 501.86 ml   Weight change: 0 kg  Physical Exam: Gen: nad, laying flat in bed CVS: s1s2, rrr, no m/r/g Resp:cta bl, no w/r/r/c, unlabored, bl chest expansion Abd: soft, nt/nd Ext: trace edema bilateral le's Neuro: aaox3, no focal deficits, speech clear and coherent  Imaging: CT ABDOMEN PELVIS WO CONTRAST  Result Date: 04/23/2020 CLINICAL DATA:  Hematuria.  Significantly worsened renal function. EXAM: CT ABDOMEN AND PELVIS WITHOUT CONTRAST TECHNIQUE: Multidetector CT imaging of the abdomen and pelvis was performed following the standard protocol without IV contrast. COMPARISON:  05/15/2014 CT abdomen report (images not available) FINDINGS: Lower chest: Small right pleural effusion.  Bibasilar atelectasis. Hepatobiliary: An approximately 8.6 cm hypoattenuating mass in the posterior right hepatic lobe likely corresponds to the benign giant hemangioma described on the prior CT. The gallbladder is unremarkable. There is no biliary dilatation. Pancreas: Unremarkable. Spleen: Unremarkable. Adrenals/Urinary Tract: Unremarkable adrenal glands. Mild-to-moderate symmetric perinephric stranding bilaterally. No renal calculi or hydronephrosis. 5.8 cm low-density lesion arising from the lower pole of the left kidney compatible with a cyst. Nondistended bladder. Stomach/Bowel: The stomach is collapsed. There is no evidence of bowel obstruction or inflammation. The appendix is unremarkable. Vascular/Lymphatic: Mild abdominal aortic atherosclerosis without aneurysm. No enlarged lymph nodes. Reproductive: Mildly enlarged prostate. Other: No intraperitoneal free fluid or free air. Small fat containing umbilical hernia. Musculoskeletal: No acute osseous abnormality or suspicious osseous lesion. Moderate disc degeneration at L4-5  and L5-S1. IMPRESSION: 1. Nonspecific bilateral perinephric stranding.  No urinary tract calculi or hydronephrosis. 2. Small right pleural effusion. 3. Known giant hepatic hemangioma. 4. Aortic Atherosclerosis (ICD10-I70.0). Electronically Signed   By: Logan Bores M.D.   On: 04/23/2020 06:06   US BIOPSY (KIDNEY)  Result Date: 04/24/2020 INDICATION: 69 year old male with a history of chronic renal disease, proteinuria, referred for biopsy EXAM: IMAGE GUIDED BIOPSY LEFT KIDNEY FOR MEDICAL RENAL PURPOSE MEDICATIONS: None. ANESTHESIA/SEDATION: Moderate (conscious) sedation was employed during this procedure. A total of Versed 1.0 mg and Fentanyl 50 mcg was administered intravenously. Moderate Sedation Time: 11 minutes. The patient's level of consciousness and vital signs were monitored continuously by radiology nursing throughout the procedure under my direct supervision. FLUOROSCOPY TIME:  None COMPLICATIONS: None PROCEDURE: Informed written consent was obtained from the patient after a thorough discussion of the procedural risks, benefits and alternatives. All questions were addressed. Maximal Sterile Barrier Technique was utilized including caps, mask, sterile gowns, sterile gloves, sterile drape, hand hygiene and skin antiseptic. A timeout was performed prior to the initiation of the procedure. Patient was positioned prone position on the gantry table. Images were stored sent to PACs. Once the patient is prepped and draped in the usual sterile fashion, the skin and subcutaneous tissues overlying the left kidney were generously infiltrated 1% lidocaine for local anesthesia. Using ultrasound guidance, a 15 gauge guide needle was advanced into the lower lateral cortex of the left kidney. Once we confirmed location of the needle tip, 2 separate 16 gauge core biopsy were achieved. Two Gel-Foam pledgets were infused with a small amount of saline. The needle was removed. Final images were stored. The patient tolerated  the procedure well and remained hemodynamically stable throughout. No complications were encountered and no significant blood loss encountered. IMPRESSION: Status post image guided medical renal biopsy. Signed, Dulcy Fanny. Dellia Nims, RPVI [facility] Electronically Signed   By: Corrie Mckusick D.O.   On: 04/24/2020 09:38   VAS US RENAL ARTERY DUPLEX  Result Date: 04/23/2020 ABDOMINAL VISCERAL Indications: Worsening AKI Limitations: Patient unable to hold breath/stay awake during exam. Comparison Study: No prior studies. Performing Technologist: Darlin Coco, RDMS  Examination Guidelines: A complete evaluation includes B-mode imaging, spectral Doppler, color Doppler, and power Doppler as needed of all accessible portions of each vessel. Bilateral testing is considered an integral part of a complete examination. Limited examinations for reoccurring indications may be performed as noted.  Duplex Findings: +----------------------+--------+--------+------+--------+ Mesenteric            PSV cm/sEDV cm/sPlaqueComments +----------------------+--------+--------+------+--------+ Aorta Mid               106      24                  +----------------------+--------+--------+------+--------+ Celiac Artery Origin    146      38                  +----------------------+--------+--------+------+--------+ Celiac Artery Proximal  173      48                  +----------------------+--------+--------+------+--------+ SMA Origin              194      20                  +----------------------+--------+--------+------+--------+    +------------------+--------+--------+-------+ Right Renal ArteryPSV cm/sEDV cm/sComment +------------------+--------+--------+-------+ Origin  75      16           +------------------+--------+--------+-------+ Proximal             99      20            +------------------+--------+--------+-------+ Mid                 107      21           +------------------+--------+--------+-------+ Distal               66      15           +------------------+--------+--------+-------+ +-----------------+--------+--------+-------+ Left Renal ArteryPSV cm/sEDV cm/sComment +-----------------+--------+--------+-------+ Origin              81      15           +-----------------+--------+--------+-------+ Proximal            82      15           +-----------------+--------+--------+-------+ Mid                 81      15           +-----------------+--------+--------+-------+ Distal              91      18           +-----------------+--------+--------+-------+ +------------+--------+--------+----+-----------+--------+--------+----+ Right KidneyPSV cm/sEDV cm/sRI  Left KidneyPSV cm/sEDV cm/sRI   +------------+--------+--------+----+-----------+--------+--------+----+ Upper Pole  58      13      0.78Upper Pole 63      12      0.80 +------------+--------+--------+----+-----------+--------+--------+----+ Mid         52      10      0.81Mid        46      9       0.81 +------------+--------+--------+----+-----------+--------+--------+----+ Lower Pole  64      16      0.75Lower Pole 32      10      0.68 +------------+--------+--------+----+-----------+--------+--------+----+ Hilar       59      11      0.81Hilar                           +------------+--------+--------+----+-----------+--------+--------+----+ +------------------+-----+------------------+-----+ Right Kidney           Left Kidney             +------------------+-----+------------------+-----+ RAR                    RAR                     +------------------+-----+------------------+-----+ RAR (manual)           RAR (manual)            +------------------+-----+------------------+-----+ Cortex                 Cortex                   +------------------+-----+------------------+-----+ Cortex thickness       Corex thickness         +------------------+-----+------------------+-----+ Kidney length (cm)12.90Kidney length (cm)12.50 +------------------+-----+------------------+-----+  Summary: Renal:  Right: Abnormal right Resistive Index. Normal size right kidney. Left:  Abnormal left Resisitve Index. Normal size of  left kidney.        Cyst(s) noted.  *See table(s) above for measurements and observations.  Diagnosing physician: Ruta Hinds MD  Electronically signed by Ruta Hinds MD on 04/23/2020 at 6:13:10 PM.    Final     Labs: BMET Recent Labs  Lab 04/22/20 1836 04/24/20 0252  NA 141 139  K 3.3* 3.1*  CL 107 105  CO2 23 27  GLUCOSE 88 95  BUN 50* 46*  CREATININE 4.74* 4.73*  CALCIUM 8.3* 8.1*   CBC Recent Labs  Lab 04/22/20 1836 04/24/20 0252  WBC 6.4 7.5  NEUTROABS 3.7  --   HGB 11.4* 10.5*  HCT 36.5* 31.8*  MCV 95.8 91.4  PLT 167 127*    Medications:    . amLODipine  10 mg Oral Daily  . aspirin EC  81 mg Oral QODAY  . atorvastatin  10 mg Oral Daily  . [START ON 04/25/2020] enoxaparin (LOVENOX) injection  30 mg Subcutaneous Q24H  . fluticasone furoate-vilanterol  1 puff Inhalation Daily  . gelatin adsorbable      . lidocaine (PF)          Gean Quint, MD [facility] 04/24/2020, 1:11 PM

## 2020-04-24 NOTE — Procedures (Signed)
Interventional Radiology Procedure Note  Procedure:  US guided left medical renal biopsy Complications: None Recommendations:  - Ok to shower tomorrow - Do not submerge for 7 days - Routine wound care  - 3 hr bedrest, then per primary - ok to advance diet per primary order  Signed,  Dulcy Fanny. Earleen Newport, DO

## 2020-04-25 DIAGNOSIS — E785 Hyperlipidemia, unspecified: Secondary | ICD-10-CM | POA: Diagnosis not present

## 2020-04-25 DIAGNOSIS — N179 Acute kidney failure, unspecified: Secondary | ICD-10-CM | POA: Diagnosis not present

## 2020-04-25 DIAGNOSIS — I10 Essential (primary) hypertension: Secondary | ICD-10-CM | POA: Diagnosis not present

## 2020-04-25 LAB — RENAL FUNCTION PANEL
Albumin: 2.1 g/dL — ABNORMAL LOW (ref 3.5–5.0)
Anion gap: 9 (ref 5–15)
BUN: 45 mg/dL — ABNORMAL HIGH (ref 8–23)
CO2: 26 mmol/L (ref 22–32)
Calcium: 8.1 mg/dL — ABNORMAL LOW (ref 8.9–10.3)
Chloride: 103 mmol/L (ref 98–111)
Creatinine, Ser: 4.66 mg/dL — ABNORMAL HIGH (ref 0.61–1.24)
GFR calc Af Amer: 14 mL/min — ABNORMAL LOW (ref >60)
GFR calc non Af Amer: 12 mL/min — ABNORMAL LOW (ref >60)
Glucose, Bld: 97 mg/dL (ref 70–99)
Phosphorus: 5.2 mg/dL — ABNORMAL HIGH (ref 2.5–4.6)
Potassium: 2.9 mmol/L — ABNORMAL LOW (ref 3.5–5.1)
Sodium: 138 mmol/L (ref 135–145)

## 2020-04-25 LAB — FOLATE: Folate: 9.3 ng/mL (ref >5.9)

## 2020-04-25 LAB — LIPID PANEL
Cholesterol: 123 mg/dL (ref 0–200)
HDL: 33 mg/dL — ABNORMAL LOW (ref >40)
LDL Cholesterol: 74 mg/dL (ref 0–99)
Total CHOL/HDL Ratio: 3.7 RATIO
Triglycerides: 81 mg/dL (ref <150)
VLDL: 16 mg/dL (ref 0–40)

## 2020-04-25 LAB — C4 COMPLEMENT: Complement C4, Body Fluid: 20 mg/dL (ref 12–38)

## 2020-04-25 LAB — IRON AND TIBC
Iron: 66 ug/dL (ref 45–182)
Saturation Ratios: 40 % — ABNORMAL HIGH (ref 17.9–39.5)
TIBC: 165 ug/dL — ABNORMAL LOW (ref 250–450)
UIBC: 99 ug/dL

## 2020-04-25 LAB — CBC WITH DIFFERENTIAL/PLATELET
Abs Immature Granulocytes: 0.03 10*3/uL (ref 0.00–0.07)
Basophils Absolute: 0 10*3/uL (ref 0.0–0.1)
Basophils Relative: 0 %
Eosinophils Absolute: 0.1 10*3/uL (ref 0.0–0.5)
Eosinophils Relative: 1 %
HCT: 30.2 % — ABNORMAL LOW (ref 39.0–52.0)
Hemoglobin: 9.9 g/dL — ABNORMAL LOW (ref 13.0–17.0)
Immature Granulocytes: 0 %
Lymphocytes Relative: 21 %
Lymphs Abs: 1.7 10*3/uL (ref 0.7–4.0)
MCH: 29.8 pg (ref 26.0–34.0)
MCHC: 32.8 g/dL (ref 30.0–36.0)
MCV: 91 fL (ref 80.0–100.0)
Monocytes Absolute: 1.1 10*3/uL — ABNORMAL HIGH (ref 0.1–1.0)
Monocytes Relative: 14 %
Neutro Abs: 5.2 10*3/uL (ref 1.7–7.7)
Neutrophils Relative %: 64 %
Platelets: 124 10*3/uL — ABNORMAL LOW (ref 150–400)
RBC: 3.32 MIL/uL — ABNORMAL LOW (ref 4.22–5.81)
RDW: 13.2 % (ref 11.5–15.5)
WBC: 8.2 10*3/uL (ref 4.0–10.5)
nRBC: 0 % (ref 0.0–0.2)

## 2020-04-25 LAB — CK: Total CK: 81 U/L (ref 49–397)

## 2020-04-25 LAB — HEPATITIS B SURFACE ANTIBODY, QUANTITATIVE: Hep B S AB Quant (Post): <3.1 m[IU]/mL — ABNORMAL LOW (ref >9.9)

## 2020-04-25 LAB — C3 COMPLEMENT: C3 Complement: 29 mg/dL — ABNORMAL LOW (ref 82–167)

## 2020-04-25 LAB — VITAMIN B12: Vitamin B-12: 531 pg/mL (ref 180–914)

## 2020-04-25 LAB — ANTI-DNA ANTIBODY, DOUBLE-STRANDED: ds DNA Ab: <1 IU/mL (ref 0–9)

## 2020-04-25 LAB — FERRITIN: Ferritin: 562 ng/mL — ABNORMAL HIGH (ref 24–336)

## 2020-04-25 MED ORDER — POTASSIUM CHLORIDE CRYS ER 20 MEQ PO TBCR
40.0000 meq | EXTENDED_RELEASE_TABLET | Freq: Once | ORAL | Status: AC
  Administered 2020-04-25: 40 meq via ORAL
  Filled 2020-04-25: qty 2

## 2020-04-25 MED ORDER — PANTOPRAZOLE SODIUM 40 MG PO TBEC
40.0000 mg | DELAYED_RELEASE_TABLET | Freq: Every day | ORAL | Status: DC
  Administered 2020-04-25 – 2020-04-29 (×5): 40 mg via ORAL
  Filled 2020-04-25 (×5): qty 1

## 2020-04-25 MED ORDER — HYDRALAZINE HCL 20 MG/ML IJ SOLN
10.0000 mg | Freq: Once | INTRAMUSCULAR | Status: AC
  Administered 2020-04-25: 10 mg via INTRAVENOUS
  Filled 2020-04-25: qty 1

## 2020-04-25 MED ORDER — PREDNISONE 50 MG PO TABS
60.0000 mg | ORAL_TABLET | Freq: Every day | ORAL | Status: DC
  Administered 2020-04-25 – 2020-04-29 (×5): 60 mg via ORAL
  Filled 2020-04-25 (×5): qty 1

## 2020-04-25 NOTE — Treatment Plan (Signed)
Informed of preliminary native kidney biopsy results: active diffuse proliferative lesions with endocapillary lesions, ~10% crescents, IF positive for c3. Intense inflammation with very little permanent destruction. 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 Awaiting final report. Will start prednisone and send further labs.  Paul Quint, MD [facility]

## 2020-04-25 NOTE — Progress Notes (Signed)
[facility] Progress Note    Assessment/ Plan:   1. Acute kidney Injury, etiology currently unclear urine sediment does reveal large amounts of blood and has a urine protein creatinine ratio of 8.23 g.  I am concerned for possible autoimmune process for example vasculitis/GN (however ANCA titers and ANA negative).  There is also a possibility of a collapsing variant of FSGS.  Seems like his baseline cr is around 1.3 per careeverywhere in epic. -s/p native kidney biopsy 04/24/2020, no results yet.  Given stability and renal function will hold off on empiric treatment -renal art duplex reviewed: RI's not significantly elevated (esp with normal sized kidneys) -Hepatitis panel, HIV negative -reordered complement levels -Avoid nephrotoxic medications including NSAIDs and iodinated intravenous contrast exposure unless the latter is absolutely indicated.  Preferred narcotic agents for pain control are hydromorphone, fentanyl, and methadone. Morphine should not be used. Avoid Baclofen and avoid oral sodium phosphate and magnesium citrate based laxatives / bowel preps. Continue strict Input and Output monitoring. Will monitor the patient closely with you and intervene or adjust therapy as indicated by changes in clinical status/labs  2. Nephrotic range proteinuria, nephrotic syndrome -volume status acceptable (suprisingly not significantly volume up) -questionable if needs prophylactic anticoagulation (typically applies for membranous nephropathy), will await final pathology 3. Hypokalemia, replete prn 4. HTN -agree with norvasc, start coreg 12.5mg  bid.  If no improvement increase Coreg to 25 mg twice daily -avoid hydralazine please -ideal goal <130/80  Subjective:   Feels overall well, no complaints and no changes.  Received hydralazine again for hypertension, discharged Coreg yesterday.   Objective:   BP (!) 151/87 (BP Location: Right Arm)   Pulse 78   Temp 98 F (36.7 C) (Oral)    Resp 15   Ht 6\' 3"  (1.905 m)   Wt 97.4 kg   SpO2 99%   BMI 26.84 kg/m   Intake/Output Summary (Last 24 hours) at 04/25/2020 1138 Last data filed at 04/25/2020 1057 Gross per 24 hour  Intake --  Output 2300 ml  Net -2300 ml   Weight change:   Physical Exam: Gen: nad, laying flat in bed CVS: s1s2, rrr, no m/r/g Resp:cta bl, no w/r/r/c, unlabored, bl chest expansion Abd: soft, nt/nd Ext: trace pedal edema Neuro: aaox3, no focal deficits, speech clear and coherent  Imaging: US BIOPSY (KIDNEY)  Result Date: 04/24/2020 INDICATION: 69 year old male with a history of chronic renal disease, proteinuria, referred for biopsy EXAM: IMAGE GUIDED BIOPSY LEFT KIDNEY FOR MEDICAL RENAL PURPOSE MEDICATIONS: None. ANESTHESIA/SEDATION: Moderate (conscious) sedation was employed during this procedure. A total of Versed 1.0 mg and Fentanyl 50 mcg was administered intravenously. Moderate Sedation Time: 11 minutes. The patient's level of consciousness and vital signs were monitored continuously by radiology nursing throughout the procedure under my direct supervision. FLUOROSCOPY TIME:  None COMPLICATIONS: None PROCEDURE: Informed written consent was obtained from the patient after a thorough discussion of the procedural risks, benefits and alternatives. All questions were addressed. Maximal Sterile Barrier Technique was utilized including caps, mask, sterile gowns, sterile gloves, sterile drape, hand hygiene and skin antiseptic. A timeout was performed prior to the initiation of the procedure. Patient was positioned prone position on the gantry table. Images were stored sent to PACs. Once the patient is prepped and draped in the usual sterile fashion, the skin and subcutaneous tissues overlying the left kidney were generously infiltrated 1% lidocaine for local anesthesia. Using ultrasound guidance, a 15 gauge guide needle was advanced into the lower lateral cortex of  the left kidney. Once we confirmed location  of the needle tip, 2 separate 16 gauge core biopsy were achieved. Two Gel-Foam pledgets were infused with a small amount of saline. The needle was removed. Final images were stored. The patient tolerated the procedure well and remained hemodynamically stable throughout. No complications were encountered and no significant blood loss encountered. IMPRESSION: Status post image guided medical renal biopsy. Signed, Dulcy Fanny. Dellia Nims, RPVI [facility] Electronically Signed   By: Corrie Mckusick D.O.   On: 04/24/2020 09:38   VAS US RENAL ARTERY DUPLEX  Result Date: 04/23/2020 ABDOMINAL VISCERAL Indications: Worsening AKI Limitations: Patient unable to hold breath/stay awake during exam. Comparison Study: No prior studies. Performing Technologist: Darlin Coco, RDMS  Examination Guidelines: A complete evaluation includes B-mode imaging, spectral Doppler, color Doppler, and power Doppler as needed of all accessible portions of each vessel. Bilateral testing is considered an integral part of a complete examination. Limited examinations for reoccurring indications may be performed as noted.  Duplex Findings: +----------------------+--------+--------+------+--------+ Mesenteric            PSV cm/sEDV cm/sPlaqueComments +----------------------+--------+--------+------+--------+ Aorta Mid               106      24                  +----------------------+--------+--------+------+--------+ Celiac Artery Origin    146      38                  +----------------------+--------+--------+------+--------+ Celiac Artery Proximal  173      48                  +----------------------+--------+--------+------+--------+ SMA Origin              194      20                  +----------------------+--------+--------+------+--------+    +------------------+--------+--------+-------+ Right Renal ArteryPSV cm/sEDV cm/sComment  +------------------+--------+--------+-------+ Origin               75      16           +------------------+--------+--------+-------+ Proximal             99      20           +------------------+--------+--------+-------+ Mid                 107      21           +------------------+--------+--------+-------+ Distal               66      15           +------------------+--------+--------+-------+ +-----------------+--------+--------+-------+ Left Renal ArteryPSV cm/sEDV cm/sComment +-----------------+--------+--------+-------+ Origin              81      15           +-----------------+--------+--------+-------+ Proximal            82      15           +-----------------+--------+--------+-------+ Mid                 81      15           +-----------------+--------+--------+-------+ Distal              91  18           +-----------------+--------+--------+-------+ +------------+--------+--------+----+-----------+--------+--------+----+ Right KidneyPSV cm/sEDV cm/sRI  Left KidneyPSV cm/sEDV cm/sRI   +------------+--------+--------+----+-----------+--------+--------+----+ Upper Pole  58      13      0.78Upper Pole 63      12      0.80 +------------+--------+--------+----+-----------+--------+--------+----+ Mid         52      10      0.81Mid        46      9       0.81 +------------+--------+--------+----+-----------+--------+--------+----+ Lower Pole  64      16      0.75Lower Pole 32      10      0.68 +------------+--------+--------+----+-----------+--------+--------+----+ Hilar       59      11      0.81Hilar                           +------------+--------+--------+----+-----------+--------+--------+----+ +------------------+-----+------------------+-----+ Right Kidney           Left Kidney             +------------------+-----+------------------+-----+ RAR                    RAR                      +------------------+-----+------------------+-----+ RAR (manual)           RAR (manual)            +------------------+-----+------------------+-----+ Cortex                 Cortex                  +------------------+-----+------------------+-----+ Cortex thickness       Corex thickness         +------------------+-----+------------------+-----+ Kidney length (cm)12.90Kidney length (cm)12.50 +------------------+-----+------------------+-----+  Summary: Renal:  Right: Abnormal right Resistive Index. Normal size right kidney. Left:  Abnormal left Resisitve Index. Normal size of left kidney.        Cyst(s) noted.  *See table(s) above for measurements and observations.  Diagnosing physician: Ruta Hinds MD  Electronically signed by Ruta Hinds MD on 04/23/2020 at 6:13:10 PM.    Final     Labs: BMET Recent Labs  Lab 04/22/20 1836 04/24/20 0252 04/25/20 0234  NA 141 139 138  K 3.3* 3.1* 2.9*  CL 107 105 103  CO2 23 27 26   GLUCOSE 88 95 97  BUN 50* 46* 45*  CREATININE 4.74* 4.73* 4.66*  CALCIUM 8.3* 8.1* 8.1*  PHOS  --   --  5.2*   CBC Recent Labs  Lab 04/22/20 1836 04/24/20 0252 04/25/20 0234  WBC 6.4 7.5 8.2  NEUTROABS 3.7  --  5.2  HGB 11.4* 10.5* 9.9*  HCT 36.5* 31.8* 30.2*  MCV 95.8 91.4 91.0  PLT 167 127* 124*    Medications:    . amLODipine  10 mg Oral Daily  . aspirin EC  81 mg Oral QODAY  . atorvastatin  10 mg Oral Daily  . carvedilol  12.5 mg Oral BID WC  . enoxaparin (LOVENOX) injection  30 mg Subcutaneous Q24H  . fluticasone furoate-vilanterol  1 puff Inhalation Daily      Gean Quint, MD [facility] 04/25/2020, 11:38 AM

## 2020-04-25 NOTE — Progress Notes (Signed)
Pt assessed for elevated BP's. On call provider notified. Given one time dose of 10 mg hydralazine. Current BP 162/87. Provider satisfied with current BP. Pt due to receive oral BP medication this a.m.

## 2020-04-25 NOTE — Progress Notes (Signed)
PROGRESS NOTE  Paul Donaldson FKC:[id number] DOB: Jun 10, 1951 DOA: 04/22/2020 PCP: Paul Bill, MD  HPI/Recap of past 24 hours: HPI from Paul Donaldson is a 69 y.o. male with medical history significant of HTN presenting with AKI. He reports that he has been feeling weak and anorexic for several weeks.  He was initially seen on 7/6 at the [facility] ER for similar symptoms; he was diagnosed with a UTI and given Keflex.  He took the Keflex but has not had improvement in his symptoms and has noticed worsening BP elevation as well as brownish colored urine.  He went to his PCP, who attempted to arrange for nephrology evaluation and was ultimately told to come to the ER. In the ED, Cr 1.7 -> 3.7. Nephrology consulted. Pt admitted for further management.     Today, patient denies any new complaints, still with some nausea and poor appetite.  Patient denies any abdominal pain, vomiting, chest pain, shortness of breath, fever/chills.   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 Baseline creatinine around 1.3 Unknown etiology, suspecting possible vasculitis/GN UA reveals large amount of blood, urine protein creatinine ratio of 8.23 g Lipid panel WNL except for low HDL P-ANCA, C-ANCA, ANA all negative Status post kidney biopsy on 04/24/2020, preliminary report showed active diffuse proliferative lesion, awaiting final report Nephrology on board, appreciate recs, starting prednisone Continue IV fluids Daily BMP  Hypertension BP stable Continue Norvasc, started on Coreg 12.5 mg twice daily by nephrology  Hypokalemia Replace as needed  Normocytic anemia Likely worsened by IV fluid/hemodilution Anemia panel showed Paul 66, sats 40, ferritin 562 Daily  CBC  Hyperlipidemia Continue Lipitor  Asthma Continue inhaler         Malnutrition Type:      Malnutrition Characteristics:      Nutrition Interventions:       Estimated body mass index is 26.84 kg/m as calculated from the following:   Height as of this encounter: 6\' 3"  (1.905 m).   Weight as of this encounter: 97.4 kg.     Code Status: Full  Family Communication: Discussed with patient  Disposition Plan: Status is: Inpatient  Remains inpatient appropriate because:Inpatient level of care appropriate due to severity of illness   Dispo: The patient is from: Home              Anticipated d/c is to: Home              Anticipated d/c date is: 2 days              Patient currently is not medically stable to d/c.     Consultants:  Nephrology  Procedures:  Renal biopsy on 04/24/2020  Antimicrobials:  None  DVT prophylaxis: Lovenox   Objective: Vitals:   04/25/20 1120 04/25/20 1500 04/25/20 1655 04/25/20 1656  BP: (!) 151/87 (!) 135/84 (!) 150/85 (!) 150/85  Pulse: 78 87 86 82  Resp: 15 18  19   Temp: 98 F (36.7 C)     TempSrc: Oral     SpO2: 99% 96%  100%  Weight:      Height:        Intake/Output Summary (Last 24 hours) at 04/25/2020 1834 Last data filed at 04/25/2020 1501 Gross per 24 hour  Intake 480 ml  Output 2100 ml  Net -1620 ml   Filed Weights   04/22/20 1801 04/23/20 0830 04/23/20 2117  Weight: 97.5 kg 97.5 kg 97.4 kg    Exam:  General: NAD   Cardiovascular: S1, S2 present  Respiratory: CTAB  Abdomen: Soft, nontender, nondistended, bowel sounds present  Musculoskeletal: Trace bilateral pedal edema noted  Skin: Normal  Psychiatry: Normal mood    Data Reviewed: CBC: Recent Labs  Lab 04/22/20 1836 04/24/20 0252 04/25/20 0234  WBC 6.4 7.5 8.2  NEUTROABS 3.7  --  5.2  HGB 11.4* 10.5* 9.9*  HCT 36.5* 31.8* 30.2*  MCV 95.8 91.4 91.0  PLT 167 127* 973*   Basic Metabolic Panel: Recent Labs  Lab  04/22/20 1836 04/24/20 0252 04/25/20 0234  NA 141 139 138  K 3.3* 3.1* 2.9*  CL 107 105 103  CO2 23 27 26   GLUCOSE 88 95 97  BUN 50* 46* 45*  CREATININE 4.74* 4.73* 4.66*  CALCIUM 8.3* 8.1* 8.1*  PHOS  --   --  5.2*   GFR: Estimated Creatinine Clearance: 17.4 mL/min (A) (by C-G formula based on SCr of 4.66 mg/dL (H)). Liver Function Tests: Recent Labs  Lab 04/22/20 1836 04/25/20 0234  AST 22  --   ALT 22  --   ALKPHOS 70  --   BILITOT 1.1  --   PROT 7.7  --   ALBUMIN 2.7* 2.1*   No results for input(s): LIPASE, AMYLASE in the last 168 hours. No results for input(s): AMMONIA in the last 168 hours. Coagulation Profile: Recent Labs  Lab 04/24/20 0252  INR 1.1   Cardiac Enzymes: Recent Labs  Lab 04/25/20 0234  CKTOTAL 81   BNP (last 3 results) No results for input(s): PROBNP in the last 8760 hours. HbA1C: No results for input(s): HGBA1C in the last 72 hours. CBG: No results for input(s): GLUCAP in the last 168 hours. Lipid Profile: Recent Labs    04/25/20 0234  CHOL 123  HDL 33*  LDLCALC 74  TRIG 81  CHOLHDL 3.7   Thyroid Function Tests: No results for input(s): TSH, T4TOTAL, FREET4, T3FREE, THYROIDAB in the last 72 hours. Anemia Panel: Recent Labs    04/25/20 0234  VITAMINB12 531  FOLATE 9.3  FERRITIN 562*  TIBC 165*  Paul 66   Urine analysis:    Component Value Date/Time   COLORURINE YELLOW 04/22/2020 1830   APPEARANCEUR HAZY (A) 04/22/2020 1830   LABSPEC 1.011 04/22/2020 1830   PHURINE 6.0 04/22/2020 1830   GLUCOSEU 50 (A) 04/22/2020 1830   HGBUR LARGE (A) 04/22/2020 1830   BILIRUBINUR NEGATIVE 04/22/2020 1830   KETONESUR NEGATIVE 04/22/2020 1830   PROTEINUR >=300 (A) 04/22/2020 1830   NITRITE NEGATIVE 04/22/2020 1830   LEUKOCYTESUR SMALL (A) 04/22/2020 1830   Sepsis Labs: @LABRCNTIP (procalcitonin:4,lacticidven:4)  ) Recent Results (from the past 240 hour(s))  SARS Coronavirus 2 by RT PCR (hospital order, performed in [place] hospital lab) Nasopharyngeal Nasopharyngeal Swab     Status: None   Collection Time: 04/23/20  7:15 AM   Specimen: Nasopharyngeal Swab  Result Value Ref Range Status   SARS Coronavirus 2 NEGATIVE NEGATIVE Final    Comment: (NOTE) SARS-CoV-2 target nucleic acids are NOT DETECTED.  The SARS-CoV-2 RNA is generally detectable in upper and lower respiratory specimens during the acute phase of infection. The lowest concentration of SARS-CoV-2 viral copies this assay can detect is 250 copies / mL. A negative result does not preclude SARS-CoV-2 infection and should not be used as the sole  basis for treatment or other patient management decisions.  A negative result may occur with improper specimen collection / handling, submission of specimen other than nasopharyngeal swab, presence of viral mutation(s) within the areas targeted by this assay, and inadequate number of viral copies (<250 copies / mL). A negative result must be combined with clinical observations, patient history, and epidemiological information.  Fact Sheet for Patients:   StrictlyIdeas.no  Fact Sheet for Healthcare Providers: BankingDealers.co.za  This test is not yet approved or  cleared by the [place] FDA and has been authorized for detection and/or diagnosis of SARS-CoV-2 by FDA under an Emergency Use Authorization (EUA).  This EUA will remain in effect (meaning this test can be used) for the duration of the COVID-19 declaration under Section 564(b)(1) of the Act, 21 U.S.C. section 360bbb-3(b)(1), unless the authorization is terminated or revoked sooner.  Performed at [facility], [place] [address] [place], [place] [address]   MRSA PCR Screening     Status: None   Collection Time: 04/23/20  9:31 PM   Specimen: Nasopharyngeal  Result Value Ref Range Status   MRSA by PCR NEGATIVE NEGATIVE Final    Comment:        The GeneXpert MRSA Assay  (FDA approved for NASAL specimens only), is one component of a comprehensive MRSA colonization surveillance program. It is not intended to diagnose MRSA infection nor to guide or monitor treatment for MRSA infections. Performed at [facility], [place] [address], [place], [place] [address]       Studies: No results found.  Scheduled Meds: . amLODipine  10 mg Oral Daily  . aspirin EC  81 mg Oral QODAY  . atorvastatin  10 mg Oral Daily  . carvedilol  12.5 mg Oral BID WC  . enoxaparin (LOVENOX) injection  30 mg Subcutaneous Q24H  . fluticasone furoate-vilanterol  1 puff Inhalation Daily  . pantoprazole  40 mg Oral Daily  . predniSONE  60 mg Oral Daily    Continuous Infusions: . lactated ringers 125 mL/hr at 04/25/20 0631     LOS: 2 days     Alma Friendly, MD [facility]  If 7PM-7AM, please contact night-coverage [email or web address] 04/25/2020, 6:34 PM

## 2020-04-26 DIAGNOSIS — N179 Acute kidney failure, unspecified: Secondary | ICD-10-CM | POA: Diagnosis not present

## 2020-04-26 DIAGNOSIS — E785 Hyperlipidemia, unspecified: Secondary | ICD-10-CM | POA: Diagnosis not present

## 2020-04-26 DIAGNOSIS — I10 Essential (primary) hypertension: Secondary | ICD-10-CM | POA: Diagnosis not present

## 2020-04-26 LAB — CBC WITH DIFFERENTIAL/PLATELET
Abs Immature Granulocytes: 0.04 10*3/uL (ref 0.00–0.07)
Basophils Absolute: 0 10*3/uL (ref 0.0–0.1)
Basophils Relative: 0 %
Eosinophils Absolute: 0 10*3/uL (ref 0.0–0.5)
Eosinophils Relative: 0 %
HCT: 28.5 % — ABNORMAL LOW (ref 39.0–52.0)
Hemoglobin: 9.7 g/dL — ABNORMAL LOW (ref 13.0–17.0)
Immature Granulocytes: 1 %
Lymphocytes Relative: 11 %
Lymphs Abs: 0.9 10*3/uL (ref 0.7–4.0)
MCH: 30.7 pg (ref 26.0–34.0)
MCHC: 34 g/dL (ref 30.0–36.0)
MCV: 90.2 fL (ref 80.0–100.0)
Monocytes Absolute: 0.2 10*3/uL (ref 0.1–1.0)
Monocytes Relative: 2 %
Neutro Abs: 7.2 10*3/uL (ref 1.7–7.7)
Neutrophils Relative %: 86 %
Platelets: 138 10*3/uL — ABNORMAL LOW (ref 150–400)
RBC: 3.16 MIL/uL — ABNORMAL LOW (ref 4.22–5.81)
RDW: 13.1 % (ref 11.5–15.5)
WBC: 8.3 10*3/uL (ref 4.0–10.5)
nRBC: 0 % (ref 0.0–0.2)

## 2020-04-26 LAB — COMPLEMENT, TOTAL: Compl, Total (CH50): 21 U/mL — ABNORMAL LOW (ref >41)

## 2020-04-26 LAB — RENAL FUNCTION PANEL
Albumin: 2 g/dL — ABNORMAL LOW (ref 3.5–5.0)
Anion gap: 12 (ref 5–15)
BUN: 56 mg/dL — ABNORMAL HIGH (ref 8–23)
CO2: 23 mmol/L (ref 22–32)
Calcium: 8.2 mg/dL — ABNORMAL LOW (ref 8.9–10.3)
Chloride: 105 mmol/L (ref 98–111)
Creatinine, Ser: 4.75 mg/dL — ABNORMAL HIGH (ref 0.61–1.24)
GFR calc Af Amer: 13 mL/min — ABNORMAL LOW (ref >60)
GFR calc non Af Amer: 11 mL/min — ABNORMAL LOW (ref >60)
Glucose, Bld: 135 mg/dL — ABNORMAL HIGH (ref 70–99)
Phosphorus: 4.4 mg/dL (ref 2.5–4.6)
Potassium: 3.7 mmol/L (ref 3.5–5.1)
Sodium: 140 mmol/L (ref 135–145)

## 2020-04-26 LAB — C4 COMPLEMENT: Complement C4, Body Fluid: 17 mg/dL (ref 12–38)

## 2020-04-26 LAB — ANTISTREPTOLYSIN O TITER: ASO: 1383 IU/mL — ABNORMAL HIGH (ref 0.0–200.0)

## 2020-04-26 LAB — C3 COMPLEMENT: C3 Complement: 24 mg/dL — ABNORMAL LOW (ref 82–167)

## 2020-04-26 NOTE — Plan of Care (Signed)

## 2020-04-26 NOTE — Progress Notes (Signed)
[facility] Progress Note    Assessment/ Plan:   1. Acute kidney Injury, etiology initially unclear urine sediment does reveal large amounts of blood and has a urine protein creatinine ratio of 8.23 g. Seems like his baseline cr is around 1.3 per careeverywhere in epic. 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 Intense inflammation with very little signs of permanent destruction -started prednisone 60mg  daily (7/23), plan for 6 month regimen for now. Until final pathology has resulted, will make a decision on further immunosuppression if indicated (I.e. if actual c3 glomerulopathy then will trial MMF 1g BID for ~6 months). GI ppx: protonix - Discussed prelim results with Dr. Donald Prose from [place], awaiting final path and report -C3 low, C4 wnl -Hepatitis panel, HIV negative -CH50, ASO titers -ordered spep w/ if and FLC for completion of work up -stop ivf -if renal function stable -Avoid nephrotoxic medications including NSAIDs and iodinated intravenous contrast exposure unless the latter is absolutely indicated.  Preferred narcotic agents for pain control are hydromorphone, fentanyl, and methadone. Morphine should not be used. Avoid Baclofen and avoid oral sodium phosphate and magnesium citrate based laxatives / bowel preps. Continue strict Input and Output monitoring. Will monitor the patient closely with you and intervene or adjust therapy as indicated by changes in clinical status/labs  2. Nephrotic range proteinuria, nephrotic syndrome -volume status acceptable (suprisingly not significantly volume up). Hold on starting anticoagulation for now unless persistently nephrotic. -repeat UPC in about 3-4 weeks to ensure improvement 3. Hypokalemia, replete prn 4. HTN -agree with norvasc, start coreg 12.5mg  bid.  If no improvement increase Coreg to  25 mg twice daily (will stop IVF to see if there is any improvement) -avoid hydralazine please -ideal goal <130/80  --If renal function remains stable (or slight improvement) tomorrow/Mond then okay from nephrology perspective for discharge to home with close outpatient follow up.  Subjective:   Feels overall well, no complaints and no changes.    Objective:   BP (!) 157/87 (BP Location: Right Leg)   Pulse 85   Temp 97.9 F (36.6 C) (Oral)   Resp 21   Ht 6\' 3"  (1.905 m)   Wt 97.4 kg   SpO2 100%   BMI 26.84 kg/m   Intake/Output Summary (Last 24 hours) at 04/26/2020 1023 Last data filed at 04/25/2020 1901 Gross per 24 hour  Intake 1712.38 ml  Output 575 ml  Net 1137.38 ml   Weight change:   Physical Exam: Gen: nad, laying flat in bed CVS: s1s2, rrr, no m/r/g Resp:cta bl, no w/r/r/c, unlabored, bl chest expansion Abd: soft, nt/nd Ext: trace pedal edema Neuro: aaox3, no focal deficits, speech clear and coherent  Imaging: No results found.  Labs: BMET Recent Labs  Lab 04/22/20 1836 04/24/20 0252 04/25/20 0234 04/26/20 0239  NA 141 139 138 140  K 3.3* 3.1* 2.9* 3.7  CL 107 105 103 105  CO2 23 27 26 23   GLUCOSE 88 95 97 135*  BUN 50* 46* 45* 56*  CREATININE 4.74* 4.73* 4.66* 4.75*  CALCIUM 8.3* 8.1* 8.1* 8.2*  PHOS  --   --  5.2* 4.4   CBC Recent Labs  Lab 04/22/20 1836 04/24/20 0252 04/25/20 0234 04/26/20 0239  WBC 6.4 7.5 8.2 8.3  NEUTROABS 3.7  --  5.2 7.2  HGB 11.4* 10.5* 9.9* 9.7*  HCT 36.5* 31.8* 30.2* 28.5*  MCV 95.8 91.4 91.0 90.2  PLT 167 127* 124* 138*    Medications:    . amLODipine  10 mg Oral Daily  . aspirin EC  81 mg Oral QODAY  . atorvastatin  10 mg Oral Daily  . carvedilol  12.5 mg Oral BID WC  . enoxaparin (LOVENOX) injection  30 mg Subcutaneous Q24H  . fluticasone furoate-vilanterol  1 puff Inhalation Daily  . pantoprazole  40 mg Oral Daily  . predniSONE  60 mg Oral Daily      Gean Quint, MD [facility] 04/26/2020, 10:23 AM

## 2020-04-26 NOTE — Progress Notes (Signed)
PROGRESS NOTE  ESSIE GEHRET RKY:[id number] DOB: 1951/06/16 DOA: 04/22/2020 PCP: Bartholome Bill, MD  HPI/Recap of past 24 hours: HPI from Dr Jacquenette Shone is a 69 y.o. male with medical history significant of HTN presenting with AKI. He reports that he has been feeling weak and anorexic for several weeks.  He was initially seen on 7/6 at the [facility] ER for similar symptoms; he was diagnosed with a UTI and given Keflex.  He took the Keflex but has not had improvement in his symptoms and has noticed worsening BP elevation as well as brownish colored urine.  He went to his PCP, who attempted to arrange for nephrology evaluation and was ultimately told to come to the ER. In the ED, Cr 1.7 -> 3.7. Nephrology consulted. Pt admitted for further management.     Today, patient denies any new complaints.   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 Baseline creatinine around 1.3 Unknown etiology, suspecting possible vasculitis/GN UA reveals large amount of blood, urine protein creatinine ratio of 8.23 g Lipid panel WNL except for low HDL P-ANCA, C-ANCA, ANA all negative Status post kidney biopsy on 04/24/2020, preliminary report showed active diffuse proliferative lesion, awaiting final report Nephrology on board, appreciate recs, continue prednisone D/C IV fluids Daily BMP  Hypertension BP stable Continue Norvasc, started on Coreg 12.5 mg twice daily by nephrology  Hypokalemia Replace as needed  Normocytic anemia Likely worsened by IV fluid/hemodilution Anemia panel showed iron 66, sats 40, ferritin 562 Daily CBC  Hyperlipidemia Continue Lipitor  Asthma Continue inhaler         Malnutrition Type:      Malnutrition  Characteristics:      Nutrition Interventions:       Estimated body mass index is 26.84 kg/m as calculated from the following:   Height as of this encounter: 6\' 3"  (1.905 m).   Weight as of this encounter: 97.4 kg.     Code Status: Full  Family Communication: Discussed with patient  Disposition Plan: Status is: Inpatient  Remains inpatient appropriate because:Inpatient level of care appropriate due to severity of illness   Dispo: The patient is from: Home              Anticipated d/c is to: Home              Anticipated d/c date is: 2 days              Patient currently is not medically stable to d/c.     Consultants:  Nephrology  Procedures:  Renal biopsy on 04/24/2020  Antimicrobials:  None  DVT prophylaxis: Lovenox   Objective: Vitals:   04/26/20 0934 04/26/20 1200 04/26/20 1308 04/26/20 1604  BP:   (!) 139/77 (!) 148/83  Pulse:  83 80 85  Resp:  (!) 30 18 17   Temp:   98.7 F (37.1 C) 98.5 F (36.9 C)  TempSrc:   Oral Oral  SpO2: 100% 98% 98% 96%  Weight:      Height:        Intake/Output Summary (Last 24 hours) at 04/26/2020 1755 Last data filed at 04/25/2020 1901 Gross per 24 hour  Intake 1472.38 ml  Output --  Net 1472.38 ml   Filed Weights   04/22/20 1801 04/23/20 0830 04/23/20 2117  Weight: 97.5 kg  97.5 kg 97.4 kg    Exam:  General: NAD   Cardiovascular: S1, S2 present  Respiratory: CTAB  Abdomen: Soft, nontender, nondistended, bowel sounds present  Musculoskeletal: Trace bilateral pedal edema noted  Skin: Normal  Psychiatry: Normal mood    Data Reviewed: CBC: Recent Labs  Lab 04/22/20 1836 04/24/20 0252 04/25/20 0234 04/26/20 0239  WBC 6.4 7.5 8.2 8.3  NEUTROABS 3.7  --  5.2 7.2  HGB 11.4* 10.5* 9.9* 9.7*  HCT 36.5* 31.8* 30.2* 28.5*  MCV 95.8 91.4 91.0 90.2  PLT 167 127* 124* 366*   Basic Metabolic Panel: Recent Labs  Lab 04/22/20 1836 04/24/20 0252 04/25/20 0234 04/26/20 0239  NA 141 139 138  140  K 3.3* 3.1* 2.9* 3.7  CL 107 105 103 105  CO2 23 27 26 23   GLUCOSE 88 95 97 135*  BUN 50* 46* 45* 56*  CREATININE 4.74* 4.73* 4.66* 4.75*  CALCIUM 8.3* 8.1* 8.1* 8.2*  PHOS  --   --  5.2* 4.4   GFR: Estimated Creatinine Clearance: 17 mL/min (A) (by C-G formula based on SCr of 4.75 mg/dL (H)). Liver Function Tests: Recent Labs  Lab 04/22/20 1836 04/25/20 0234 04/26/20 0239  AST 22  --   --   ALT 22  --   --   ALKPHOS 70  --   --   BILITOT 1.1  --   --   PROT 7.7  --   --   ALBUMIN 2.7* 2.1* 2.0*   No results for input(s): LIPASE, AMYLASE in the last 168 hours. No results for input(s): AMMONIA in the last 168 hours. Coagulation Profile: Recent Labs  Lab 04/24/20 0252  INR 1.1   Cardiac Enzymes: Recent Labs  Lab 04/25/20 0234  CKTOTAL 81   BNP (last 3 results) No results for input(s): PROBNP in the last 8760 hours. HbA1C: No results for input(s): HGBA1C in the last 72 hours. CBG: No results for input(s): GLUCAP in the last 168 hours. Lipid Profile: Recent Labs    04/25/20 0234  CHOL 123  HDL 33*  LDLCALC 74  TRIG 81  CHOLHDL 3.7   Thyroid Function Tests: No results for input(s): TSH, T4TOTAL, FREET4, T3FREE, THYROIDAB in the last 72 hours. Anemia Panel: Recent Labs    04/25/20 0234  VITAMINB12 531  FOLATE 9.3  FERRITIN 562*  TIBC 165*  IRON 66   Urine analysis:    Component Value Date/Time   COLORURINE YELLOW 04/22/2020 1830   APPEARANCEUR HAZY (A) 04/22/2020 1830   LABSPEC 1.011 04/22/2020 1830   PHURINE 6.0 04/22/2020 1830   GLUCOSEU 50 (A) 04/22/2020 1830   HGBUR LARGE (A) 04/22/2020 1830   BILIRUBINUR NEGATIVE 04/22/2020 1830   KETONESUR NEGATIVE 04/22/2020 1830   PROTEINUR >=300 (A) 04/22/2020 1830   NITRITE NEGATIVE 04/22/2020 1830   LEUKOCYTESUR SMALL (A) 04/22/2020 1830   Sepsis Labs: @LABRCNTIP (procalcitonin:4,lacticidven:4)  ) Recent Results (from the past 240 hour(s))  SARS Coronavirus 2 by RT PCR (hospital order,  performed in [place] hospital lab) Nasopharyngeal Nasopharyngeal Swab     Status: None   Collection Time: 04/23/20  7:15 AM   Specimen: Nasopharyngeal Swab  Result Value Ref Range Status   SARS Coronavirus 2 NEGATIVE NEGATIVE Final    Comment: (NOTE) SARS-CoV-2 target nucleic acids are NOT DETECTED.  The SARS-CoV-2 RNA is generally detectable in upper and lower respiratory specimens during the acute phase of infection. The lowest concentration of SARS-CoV-2 viral copies this assay can detect is 250 copies /  mL. A negative result does not preclude SARS-CoV-2 infection and should not be used as the sole basis for treatment or other patient management decisions.  A negative result may occur with improper specimen collection / handling, submission of specimen other than nasopharyngeal swab, presence of viral mutation(s) within the areas targeted by this assay, and inadequate number of viral copies (<250 copies / mL). A negative result must be combined with clinical observations, patient history, and epidemiological information.  Fact Sheet for Patients:   StrictlyIdeas.no  Fact Sheet for Healthcare Providers: BankingDealers.co.za  This test is not yet approved or  cleared by the [place] FDA and has been authorized for detection and/or diagnosis of SARS-CoV-2 by FDA under an Emergency Use Authorization (EUA).  This EUA will remain in effect (meaning this test can be used) for the duration of the COVID-19 declaration under Section 564(b)(1) of the Act, 21 U.S.C. section 360bbb-3(b)(1), unless the authorization is terminated or revoked sooner.  Performed at [facility], [place] [address] [place], [place] [address]   MRSA PCR Screening     Status: None   Collection Time: 04/23/20  9:31 PM   Specimen: Nasopharyngeal  Result Value Ref Range Status   MRSA by PCR NEGATIVE NEGATIVE Final    Comment:        The GeneXpert MRSA  Assay (FDA approved for NASAL specimens only), is one component of a comprehensive MRSA colonization surveillance program. It is not intended to diagnose MRSA infection nor to guide or monitor treatment for MRSA infections. Performed at [facility], [place] [address], [place], [place] [address]       Studies: No results found.  Scheduled Meds: . amLODipine  10 mg Oral Daily  . aspirin EC  81 mg Oral QODAY  . atorvastatin  10 mg Oral Daily  . carvedilol  12.5 mg Oral BID WC  . enoxaparin (LOVENOX) injection  30 mg Subcutaneous Q24H  . fluticasone furoate-vilanterol  1 puff Inhalation Daily  . pantoprazole  40 mg Oral Daily  . predniSONE  60 mg Oral Daily    Continuous Infusions:    LOS: 3 days     Alma Friendly, MD [facility]  If 7PM-7AM, please contact night-coverage [email or web address] 04/26/2020, 5:55 PM

## 2020-04-27 DIAGNOSIS — I10 Essential (primary) hypertension: Secondary | ICD-10-CM | POA: Diagnosis not present

## 2020-04-27 DIAGNOSIS — E785 Hyperlipidemia, unspecified: Secondary | ICD-10-CM | POA: Diagnosis not present

## 2020-04-27 DIAGNOSIS — N179 Acute kidney failure, unspecified: Secondary | ICD-10-CM | POA: Diagnosis not present

## 2020-04-27 LAB — CBC WITH DIFFERENTIAL/PLATELET
Abs Immature Granulocytes: 0.06 10*3/uL (ref 0.00–0.07)
Basophils Absolute: 0 10*3/uL (ref 0.0–0.1)
Basophils Relative: 0 %
Eosinophils Absolute: 0 10*3/uL (ref 0.0–0.5)
Eosinophils Relative: 0 %
HCT: 28.1 % — ABNORMAL LOW (ref 39.0–52.0)
Hemoglobin: 9.1 g/dL — ABNORMAL LOW (ref 13.0–17.0)
Immature Granulocytes: 0 %
Lymphocytes Relative: 9 %
Lymphs Abs: 1.4 10*3/uL (ref 0.7–4.0)
MCH: 30.2 pg (ref 26.0–34.0)
MCHC: 32.4 g/dL (ref 30.0–36.0)
MCV: 93.4 fL (ref 80.0–100.0)
Monocytes Absolute: 1.1 10*3/uL — ABNORMAL HIGH (ref 0.1–1.0)
Monocytes Relative: 7 %
Neutro Abs: 12.3 10*3/uL — ABNORMAL HIGH (ref 1.7–7.7)
Neutrophils Relative %: 84 %
Platelets: 168 10*3/uL (ref 150–400)
RBC: 3.01 MIL/uL — ABNORMAL LOW (ref 4.22–5.81)
RDW: 13.4 % (ref 11.5–15.5)
WBC: 14.8 10*3/uL — ABNORMAL HIGH (ref 4.0–10.5)
nRBC: 0 % (ref 0.0–0.2)

## 2020-04-27 LAB — RENAL FUNCTION PANEL
Albumin: 2.1 g/dL — ABNORMAL LOW (ref 3.5–5.0)
Anion gap: 11 (ref 5–15)
BUN: 66 mg/dL — ABNORMAL HIGH (ref 8–23)
CO2: 22 mmol/L (ref 22–32)
Calcium: 8.3 mg/dL — ABNORMAL LOW (ref 8.9–10.3)
Chloride: 103 mmol/L (ref 98–111)
Creatinine, Ser: 4.99 mg/dL — ABNORMAL HIGH (ref 0.61–1.24)
GFR calc Af Amer: 13 mL/min — ABNORMAL LOW (ref >60)
GFR calc non Af Amer: 11 mL/min — ABNORMAL LOW (ref >60)
Glucose, Bld: 140 mg/dL — ABNORMAL HIGH (ref 70–99)
Phosphorus: 4.5 mg/dL (ref 2.5–4.6)
Potassium: 3.7 mmol/L (ref 3.5–5.1)
Sodium: 136 mmol/L (ref 135–145)

## 2020-04-27 NOTE — Progress Notes (Signed)
PROGRESS NOTE  KNOX CERVI FGH:[id number] DOB: [birth year] DOA: 04/22/2020 PCP: Bartholome Bill, MD  HPI/Recap of past 24 hours: HPI from Dr Jacquenette Shone is a 69 y.o. male with medical history significant of HTN presenting with AKI. He reports that he has been feeling weak and anorexic for several weeks.  He was initially seen on 7/6 at the [facility] ER for similar symptoms; he was diagnosed with a UTI and given Keflex.  He took the Keflex but has not had improvement in his symptoms and has noticed worsening BP elevation as well as brownish colored urine.  He went to his PCP, who attempted to arrange for nephrology evaluation and was ultimately told to come to the ER. In the ED, Cr 1.7 -> 3.7. Nephrology consulted. Pt admitted for further management.     Today, patient denies any new complaints.   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 Baseline creatinine around 1.3 Unknown etiology, suspecting possible vasculitis/GN UA reveals large amount of blood, urine protein creatinine ratio of 8.23 g Lipid panel WNL except for low HDL ASO titers are elevated P-ANCA, C-ANCA, ANA all negative Status post kidney biopsy on 04/24/2020, preliminary report showed active diffuse proliferative lesion, awaiting final report Nephrology on board, appreciate recs, continue prednisone D/C IV fluids Daily BMP  Leukocytosis Likely 2/2 steroids, afebrile  Hypertension BP stable Continue Norvasc, started on Coreg 12.5 mg twice daily by nephrology  Hypokalemia Replace as needed  Normocytic anemia Likely worsened by IV fluid/hemodilution Anemia panel showed iron 66, sats 40, ferritin 562 Daily CBC  Hyperlipidemia Continue Lipitor  Asthma Continue  inhaler         Malnutrition Type:      Malnutrition Characteristics:      Nutrition Interventions:       Estimated body mass index is 26.84 kg/m as calculated from the following:   Height as of this encounter: 6\' 3"  (1.905 m).   Weight as of this encounter: 97.4 kg.     Code Status: Full  Family Communication: Discussed with patient  Disposition Plan: Status is: Inpatient  Remains inpatient appropriate because:Inpatient level of care appropriate due to severity of illness   Dispo: The patient is from: Home              Anticipated d/c is to: Home              Anticipated d/c date is: 2 days              Patient currently is not medically stable to d/c.     Consultants:  Nephrology  Procedures:  Renal biopsy on 04/24/2020  Antimicrobials:  None  DVT prophylaxis: Lovenox   Objective: Vitals:   04/27/20 0738 04/27/20 0744 04/27/20 1118 04/27/20 1600  BP:  (!) 142/80 (!) 139/74 (!) 144/84  Pulse:  80 79 81  Resp:  23 (!) 24 17  Temp:  97.6 F (36.4 C) 97.9 F (36.6 C) 97.9 F (36.6 C)  TempSrc:  Oral Oral Oral  SpO2: 95% 94% 91% 97%  Weight:      Height:        Intake/Output Summary (Last 24 hours) at 04/27/2020 1829 Last data filed at 04/27/2020 1730 Gross per 24 hour  Intake 1240 ml  Output 275 ml  Net 965 ml  Filed Weights   04/22/20 1801 04/23/20 0830 04/23/20 2117  Weight: 97.5 kg 97.5 kg 97.4 kg    Exam:  General: NAD   Cardiovascular: S1, S2 present  Respiratory: CTAB  Abdomen: Soft, nontender, nondistended, bowel sounds present  Musculoskeletal: Trace bilateral pedal edema noted  Skin: Normal  Psychiatry: Normal mood    Data Reviewed: CBC: Recent Labs  Lab 04/22/20 1836 04/24/20 0252 04/25/20 0234 04/26/20 0239 04/27/20 0238  WBC 6.4 7.5 8.2 8.3 14.8*  NEUTROABS 3.7  --  5.2 7.2 12.3*  HGB 11.4* 10.5* 9.9* 9.7* 9.1*  HCT 36.5* 31.8* 30.2* 28.5* 28.1*  MCV 95.8 91.4 91.0 90.2 93.4  PLT 167 127*  124* 138* 008   Basic Metabolic Panel: Recent Labs  Lab 04/22/20 1836 04/24/20 0252 04/25/20 0234 04/26/20 0239 04/27/20 0238  NA 141 139 138 140 136  K 3.3* 3.1* 2.9* 3.7 3.7  CL 107 105 103 105 103  CO2 23 27 26 23 22   GLUCOSE 88 95 97 135* 140*  BUN 50* 46* 45* 56* 66*  CREATININE 4.74* 4.73* 4.66* 4.75* 4.99*  CALCIUM 8.3* 8.1* 8.1* 8.2* 8.3*  PHOS  --   --  5.2* 4.4 4.5   GFR: Estimated Creatinine Clearance: 16.2 mL/min (A) (by C-G formula based on SCr of 4.99 mg/dL (H)). Liver Function Tests: Recent Labs  Lab 04/22/20 1836 04/25/20 0234 04/26/20 0239 04/27/20 0238  AST 22  --   --   --   ALT 22  --   --   --   ALKPHOS 70  --   --   --   BILITOT 1.1  --   --   --   PROT 7.7  --   --   --   ALBUMIN 2.7* 2.1* 2.0* 2.1*   No results for input(s): LIPASE, AMYLASE in the last 168 hours. No results for input(s): AMMONIA in the last 168 hours. Coagulation Profile: Recent Labs  Lab 04/24/20 0252  INR 1.1   Cardiac Enzymes: Recent Labs  Lab 04/25/20 0234  CKTOTAL 81   BNP (last 3 results) No results for input(s): PROBNP in the last 8760 hours. HbA1C: No results for input(s): HGBA1C in the last 72 hours. CBG: No results for input(s): GLUCAP in the last 168 hours. Lipid Profile: Recent Labs    04/25/20 0234  CHOL 123  HDL 33*  LDLCALC 74  TRIG 81  CHOLHDL 3.7   Thyroid Function Tests: No results for input(s): TSH, T4TOTAL, FREET4, T3FREE, THYROIDAB in the last 72 hours. Anemia Panel: Recent Labs    04/25/20 0234  VITAMINB12 531  FOLATE 9.3  FERRITIN 562*  TIBC 165*  IRON 66   Urine analysis:    Component Value Date/Time   COLORURINE YELLOW 04/22/2020 1830   APPEARANCEUR HAZY (A) 04/22/2020 1830   LABSPEC 1.011 04/22/2020 1830   PHURINE 6.0 04/22/2020 1830   GLUCOSEU 50 (A) 04/22/2020 1830   HGBUR LARGE (A) 04/22/2020 1830   BILIRUBINUR NEGATIVE 04/22/2020 1830   KETONESUR NEGATIVE 04/22/2020 1830   PROTEINUR >=300 (A) 04/22/2020 1830    NITRITE NEGATIVE 04/22/2020 1830   LEUKOCYTESUR SMALL (A) 04/22/2020 1830   Sepsis Labs: @LABRCNTIP (procalcitonin:4,lacticidven:4)  ) Recent Results (from the past 240 hour(s))  SARS Coronavirus 2 by RT PCR (hospital order, performed in [place] hospital lab) Nasopharyngeal Nasopharyngeal Swab     Status: None   Collection Time: 04/23/20  7:15 AM   Specimen: Nasopharyngeal Swab  Result Value Ref Range Status  SARS Coronavirus 2 NEGATIVE NEGATIVE Final    Comment: (NOTE) SARS-CoV-2 target nucleic acids are NOT DETECTED.  The SARS-CoV-2 RNA is generally detectable in upper and lower respiratory specimens during the acute phase of infection. The lowest concentration of SARS-CoV-2 viral copies this assay can detect is 250 copies / mL. A negative result does not preclude SARS-CoV-2 infection and should not be used as the sole basis for treatment or other patient management decisions.  A negative result may occur with improper specimen collection / handling, submission of specimen other than nasopharyngeal swab, presence of viral mutation(s) within the areas targeted by this assay, and inadequate number of viral copies (<250 copies / mL). A negative result must be combined with clinical observations, patient history, and epidemiological information.  Fact Sheet for Patients:   StrictlyIdeas.no  Fact Sheet for Healthcare Providers: BankingDealers.co.za  This test is not yet approved or  cleared by the [place] FDA and has been authorized for detection and/or diagnosis of SARS-CoV-2 by FDA under an Emergency Use Authorization (EUA).  This EUA will remain in effect (meaning this test can be used) for the duration of the COVID-19 declaration under Section 564(b)(1) of the Act, 21 U.S.C. section 360bbb-3(b)(1), unless the authorization is terminated or revoked sooner.  Performed at [facility], [place] [address],  [place], [place] [address]   MRSA PCR Screening     Status: None   Collection Time: 04/23/20  9:31 PM   Specimen: Nasopharyngeal  Result Value Ref Range Status   MRSA by PCR NEGATIVE NEGATIVE Final    Comment:        The GeneXpert MRSA Assay (FDA approved for NASAL specimens only), is one component of a comprehensive MRSA colonization surveillance program. It is not intended to diagnose MRSA infection nor to guide or monitor treatment for MRSA infections. Performed at [facility], [place] [address], [place], [place] [address]       Studies: No results found.  Scheduled Meds: . amLODipine  10 mg Oral Daily  . aspirin EC  81 mg Oral QODAY  . atorvastatin  10 mg Oral Daily  . carvedilol  12.5 mg Oral BID WC  . enoxaparin (LOVENOX) injection  30 mg Subcutaneous Q24H  . fluticasone furoate-vilanterol  1 puff Inhalation Daily  . pantoprazole  40 mg Oral Daily  . predniSONE  60 mg Oral Daily    Continuous Infusions:    LOS: 4 days     Alma Friendly, MD [facility]  If 7PM-7AM, please contact night-coverage [email or web address] 04/27/2020, 6:29 PM

## 2020-04-27 NOTE — Progress Notes (Signed)
[facility] Progress Note    Assessment/ Plan:   1. Acute kidney Injury, etiology initially unclear urine sediment does reveal large amounts of blood and has a urine protein creatinine ratio of 8.23 g. Seems like his baseline cr is around 1.3 per careeverywhere in epic. 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 Intense inflammation with very little signs of permanent destruction -started prednisone 60mg  daily (7/23), plan for 6 month regimen for now. Until final pathology has resulted, will make a decision on further immunosuppression if indicated (I.e. if actual c3 glomerulopathy then will trial MMF 1g BID for ~6 months). GI ppx: protonix - Discussed prelim results with Dr. Donald Prose from [place], awaiting final path and report -C3 low, C4 wnl, CH50 low -Hepatitis panel, HIV negative -interestingly his ASO titers came back and are elevated 1,383 (other than a recent UTI there were no other infectious symptoms I.e. skin infections or URI recently). Will update nephropathology with this -will send anti DNAase -ordered spep w/ if and FLC for completion of work up -hold off on fluids -if renal function stable -Avoid nephrotoxic medications including NSAIDs and iodinated intravenous contrast exposure unless the latter is absolutely indicated.  Preferred narcotic agents for pain control are hydromorphone, fentanyl, and methadone. Morphine should not be used. Avoid Baclofen and avoid oral sodium phosphate and magnesium citrate based laxatives / bowel preps. Continue strict Input and Output monitoring. Will monitor the patient closely with you and intervene or adjust therapy as indicated by changes in clinical status/labs  2. Nephrotic range proteinuria, nephrotic syndrome -volume status acceptable (suprisingly not significantly volume up). Hold on  starting anticoagulation for now unless persistently nephrotic. -repeat UPC in about 3-4 weeks to ensure improvement 3. Hypokalemia, replete prn 4. HTN -agree with norvasc, start coreg 12.5mg  bid.  If no improvement increase Coreg to 25 mg twice daily (bp better after stopping fluids) -avoid hydralazine please -ideal goal <130/80 5. Leukocytosis without fever, likely secondary to steroids  --If renal function remains stable (or slight improvement) then okay from nephrology perspective for discharge to home with close outpatient follow up.  Subjective:   Feels overall well, reports that his urine is clearer. Better appetite and eating more (probably from steroids)   Objective:   BP (!) 139/74 (BP Location: Right Arm)   Pulse 79   Temp 97.9 F (36.6 C) (Oral)   Resp (!) 24   Ht 6\' 3"  (1.905 m)   Wt 97.4 kg   SpO2 91%   BMI 26.84 kg/m   Intake/Output Summary (Last 24 hours) at 04/27/2020 1326 Last data filed at 04/27/2020 0800 Gross per 24 hour  Intake 220 ml  Output --  Net 220 ml   Weight change:   Physical Exam: Gen: nad, laying flat in bed CVS: s1s2, rrr, no m/r/g Resp:cta bl, no w/r/r/c, unlabored, bl chest expansion Abd: soft, nt/nd Ext: trace pedal edema Neuro: aaox3, no focal deficits, speech clear and coherent  Imaging: No results found.  Labs: BMET Recent Labs  Lab 04/22/20 1836 04/24/20 0252 04/25/20 0234 04/26/20 0239 04/27/20 0238  NA 141 139 138 140 136  K 3.3* 3.1* 2.9* 3.7 3.7  CL 107 105 103 105 103  CO2 23 27 26 23 22   GLUCOSE 88 95 97 135* 140*  BUN 50* 46* 45* 56* 66*  CREATININE 4.74* 4.73* 4.66* 4.75* 4.99*  CALCIUM 8.3* 8.1* 8.1* 8.2* 8.3*  PHOS  --   --  5.2* 4.4 4.5   CBC Recent Labs  Lab 04/22/20 1836 04/22/20 1836 04/24/20 0252 04/25/20 0234 04/26/20 0239 04/27/20 0238  WBC 6.4   < > 7.5 8.2 8.3 14.8*  NEUTROABS 3.7  --   --  5.2 7.2 12.3*  HGB 11.4*   < > 10.5* 9.9* 9.7* 9.1*  HCT 36.5*   < > 31.8* 30.2* 28.5* 28.1*   MCV 95.8   < > 91.4 91.0 90.2 93.4  PLT 167   < > 127* 124* 138* 168   < > = values in this interval not displayed.    Medications:    . amLODipine  10 mg Oral Daily  . aspirin EC  81 mg Oral QODAY  . atorvastatin  10 mg Oral Daily  . carvedilol  12.5 mg Oral BID WC  . enoxaparin (LOVENOX) injection  30 mg Subcutaneous Q24H  . fluticasone furoate-vilanterol  1 puff Inhalation Daily  . pantoprazole  40 mg Oral Daily  . predniSONE  60 mg Oral Daily      Gean Quint, MD [facility] 04/27/2020, 1:26 PM

## 2020-04-28 DIAGNOSIS — N179 Acute kidney failure, unspecified: Secondary | ICD-10-CM | POA: Diagnosis not present

## 2020-04-28 DIAGNOSIS — I10 Essential (primary) hypertension: Secondary | ICD-10-CM | POA: Diagnosis not present

## 2020-04-28 DIAGNOSIS — E785 Hyperlipidemia, unspecified: Secondary | ICD-10-CM | POA: Diagnosis not present

## 2020-04-28 LAB — KAPPA/LAMBDA LIGHT CHAINS
Kappa free light chain: 468.8 mg/L — ABNORMAL HIGH (ref 3.3–19.4)
Kappa, lambda light chain ratio: 2.34 — ABNORMAL HIGH (ref 0.26–1.65)
Lambda free light chains: 200.4 mg/L — ABNORMAL HIGH (ref 5.7–26.3)

## 2020-04-28 LAB — CBC WITH DIFFERENTIAL/PLATELET
Abs Immature Granulocytes: 0.07 10*3/uL (ref 0.00–0.07)
Basophils Absolute: 0 10*3/uL (ref 0.0–0.1)
Basophils Relative: 0 %
Eosinophils Absolute: 0 10*3/uL (ref 0.0–0.5)
Eosinophils Relative: 0 %
HCT: 28.2 % — ABNORMAL LOW (ref 39.0–52.0)
Hemoglobin: 9.3 g/dL — ABNORMAL LOW (ref 13.0–17.0)
Immature Granulocytes: 1 %
Lymphocytes Relative: 8 %
Lymphs Abs: 1 10*3/uL (ref 0.7–4.0)
MCH: 30.6 pg (ref 26.0–34.0)
MCHC: 33 g/dL (ref 30.0–36.0)
MCV: 92.8 fL (ref 80.0–100.0)
Monocytes Absolute: 0.7 10*3/uL (ref 0.1–1.0)
Monocytes Relative: 6 %
Neutro Abs: 10.7 10*3/uL — ABNORMAL HIGH (ref 1.7–7.7)
Neutrophils Relative %: 85 %
Platelets: 192 10*3/uL (ref 150–400)
RBC: 3.04 MIL/uL — ABNORMAL LOW (ref 4.22–5.81)
RDW: 13.4 % (ref 11.5–15.5)
WBC: 12.5 10*3/uL — ABNORMAL HIGH (ref 4.0–10.5)
nRBC: 0 % (ref 0.0–0.2)

## 2020-04-28 LAB — RENAL FUNCTION PANEL
Albumin: 2.1 g/dL — ABNORMAL LOW (ref 3.5–5.0)
Anion gap: 11 (ref 5–15)
BUN: 84 mg/dL — ABNORMAL HIGH (ref 8–23)
CO2: 22 mmol/L (ref 22–32)
Calcium: 8.4 mg/dL — ABNORMAL LOW (ref 8.9–10.3)
Chloride: 104 mmol/L (ref 98–111)
Creatinine, Ser: 5.11 mg/dL — ABNORMAL HIGH (ref 0.61–1.24)
GFR calc Af Amer: 12 mL/min — ABNORMAL LOW (ref >60)
GFR calc non Af Amer: 10 mL/min — ABNORMAL LOW (ref >60)
Glucose, Bld: 145 mg/dL — ABNORMAL HIGH (ref 70–99)
Phosphorus: 4.6 mg/dL (ref 2.5–4.6)
Potassium: 4 mmol/L (ref 3.5–5.1)
Sodium: 137 mmol/L (ref 135–145)

## 2020-04-28 LAB — PROTEIN ELECTROPHORESIS, SERUM
A/G Ratio: 0.6 — ABNORMAL LOW (ref 0.7–1.7)
Albumin ELP: 2.3 g/dL — ABNORMAL LOW (ref 2.9–4.4)
Alpha-1-Globulin: 0.4 g/dL (ref 0.0–0.4)
Alpha-2-Globulin: 0.6 g/dL (ref 0.4–1.0)
Beta Globulin: 0.8 g/dL (ref 0.7–1.3)
Gamma Globulin: 2.4 g/dL — ABNORMAL HIGH (ref 0.4–1.8)
Globulin, Total: 4.1 g/dL — ABNORMAL HIGH (ref 2.2–3.9)
Total Protein ELP: 6.4 g/dL (ref 6.0–8.5)

## 2020-04-28 NOTE — Progress Notes (Signed)
[facility] Progress Note    Assessment/ Plan:   1. Acute kidney Injury, etiology initially unclear urine sediment does reveal large amounts of blood and has a urine protein creatinine ratio of 8.23 g. Seems like his baseline cr is around 1.3 per careeverywhere in epic. 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 Intense inflammation with very little signs of permanent destruction -started prednisone 60mg  daily (7/23), plan for 6 month regimen for now. Until final pathology has resulted, will make a decision on further immunosuppression if indicated (I.e. if actual c3 glomerulopathy then will trial MMF 1g BID for ~6 months). GI ppx: protonix - Discussed prelim results with Dr. Donald Prose from [place], awaiting final path and report -C3 low, C4 wnl, CH50 low -Hepatitis panel, HIV negative -interestingly his ASO titers came back and are elevated 1,383 (other than a recent UTI there were no other infectious symptoms I.e. skin infections or URI recently). 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  Possible DC tomorrow  2. Nephrotic range proteinuria, nephrotic syndrome, as above -volume status acceptable (suprisingly not significantly volume up). Hold on starting anticoagulation for now unless persistently nephrotic. S/p renal bx 7/22 -repeat UPC in about 3-4 weeks to ensure improvement 3. Hypokalemia, replete prn 4. HTN  - CTM Carvedilol and AMlodipine -ideal goal <130/80 5. Leukocytosis without fever, likely secondary to steroids    Subjective:     NAEO  GOod PO, says good UOP but not recorded  Urine now normal yellow, no longer bloody  No LEE, N/V  On Pred 60mg /d   Objective:    BP (!) 143/76 (BP Location: Right Arm)   Pulse 78   Temp 98 F (36.7 C) (Oral)   Resp 14   Ht 6\' 3"  (1.905 m)   Wt 97.4 kg   SpO2 98%   BMI 26.84 kg/m   Intake/Output Summary (Last 24 hours) at 04/28/2020 0813 Last data filed at 04/27/2020 1730 Gross per 24 hour  Intake 1020 ml  Output 275 ml  Net 745 ml   Weight change:   Physical Exam: Gen: nad, laying flat in bed CVS: s1s2, rrr, no m/r/g Resp:cta bl, no w/r/r/c, unlabored, bl chest expansion Abd: soft, nt/nd Ext: trace pedal edema Neuro: aaox3, no focal deficits, speech clear and coherent  Imaging: No results found.  Labs: BMET Recent Labs  Lab 04/22/20 1836 04/24/20 0252 04/25/20 0234 04/26/20 0239 04/27/20 0238 04/28/20 0159  NA 141 139 138 140 136 137  K 3.3* 3.1* 2.9* 3.7 3.7 4.0  CL 107 105 103 105 103 104  CO2 23 27 26 23 22 22   GLUCOSE 88 95 97 135* 140* 145*  BUN 50* 46* 45* 56* 66* 84*  CREATININE 4.74* 4.73* 4.66* 4.75* 4.99* 5.11*  CALCIUM 8.3* 8.1* 8.1* 8.2* 8.3* 8.4*  PHOS  --   --  5.2* 4.4 4.5 4.6   CBC Recent Labs  Lab 04/25/20 0234 04/26/20 0239 04/27/20 0238 04/28/20 0159  WBC 8.2 8.3 14.8* 12.5*  NEUTROABS 5.2 7.2 12.3* 10.7*  HGB 9.9* 9.7* 9.1* 9.3*  HCT 30.2* 28.5* 28.1* 28.2*  MCV 91.0 90.2 93.4 92.8  PLT 124* 138* 168 192    Medications:    . amLODipine  10 mg Oral Daily  . aspirin EC  81 mg Oral QODAY  . atorvastatin  10 mg Oral Daily  . carvedilol  12.5 mg Oral BID WC  . enoxaparin (LOVENOX) injection  30 mg Subcutaneous Q24H  . fluticasone furoate-vilanterol  1 puff Inhalation Daily  . pantoprazole  40 mg Oral Daily  . predniSONE  60 mg Oral Daily      Gean Quint, MD [facility] 04/28/2020, 8:13 AM

## 2020-04-28 NOTE — Progress Notes (Signed)
PROGRESS NOTE  Paul Donaldson VXB:[id number] DOB: 1951/07/14 DOA: 04/22/2020 PCP: Bartholome Bill, MD  HPI/Recap of past 24 hours: HPI from Dr Jacquenette Shone is a 69 y.o. male with medical history significant of HTN presenting with AKI. He reports that he has been feeling weak and anorexic for several weeks.  He was initially seen on 7/6 at the [facility] ER for similar symptoms; he was diagnosed with a UTI and given Keflex.  He took the Keflex but has not had improvement in his symptoms and has noticed worsening BP elevation as well as brownish colored urine.  He went to his PCP, who attempted to arrange for nephrology evaluation and was ultimately told to come to the ER. In the ED, Cr 1.7 -> 3.7. Nephrology consulted. Pt admitted for further management.     Today, saw pt this am, denied any nausea/vomiting, appetite has improved, reports urine has cleared up, initially noted obvious hematuria.  No chest pain, shortness of breath, abdominal pain, fever/chills.    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 Baseline creatinine around 1.3, creatinine slowly rising Unknown etiology, suspecting possible vasculitis/GN UA reveals large amount of blood, urine protein creatinine ratio of 8.23 g Lipid panel WNL except for low HDL ASO titers are elevated P-ANCA, C-ANCA, ANA all negative Status post kidney biopsy on 04/24/2020, preliminary report showed active diffuse proliferative lesion, awaiting final report Nephrology on board, appreciate recs, continue prednisone D/C IV fluids Daily BMP  Leukocytosis Likely 2/2 steroids, afebrile  Hypertension BP stable Continue Norvasc, started on Coreg 12.5 mg twice daily by nephrology  Hypokalemia Replace as  needed  Normocytic anemia Likely worsened by IV fluid/hemodilution Anemia panel showed iron 66, sats 40, ferritin 562 Daily CBC  Hyperlipidemia Continue Lipitor  Asthma Continue inhaler         Malnutrition Type:      Malnutrition Characteristics:      Nutrition Interventions:       Estimated body mass index is 26.84 kg/m as calculated from the following:   Height as of this encounter: 6\' 3"  (1.905 m).   Weight as of this encounter: 97.4 kg.     Code Status: Full  Family Communication: Discussed with patient  Disposition Plan: Status is: Inpatient  Remains inpatient appropriate because:Inpatient level of care appropriate due to severity of illness   Dispo: The patient is from: Home              Anticipated d/c is to: Home              Anticipated d/c date is: 2 days              Patient currently is not medically stable to d/c.     Consultants:  Nephrology  Procedures:  Renal biopsy on 04/24/2020  Antimicrobials:  None  DVT prophylaxis: Lovenox   Objective: Vitals:   04/28/20 0733 04/28/20 0740 04/28/20 1209 04/28/20 1600  BP:   (!) 151/80 (!) 159/84  Pulse:    77  Resp:    14  Temp: 97.6 F (36.4 C)   97.6 F (36.4 C)  TempSrc: Oral   Oral  SpO2:  98%  96%  Weight:      Height:        Intake/Output Summary (Last 24 hours) at 04/28/2020 1633  Last data filed at 04/28/2020 1213 Gross per 24 hour  Intake 400 ml  Output 550 ml  Net -150 ml   Filed Weights   04/22/20 1801 04/23/20 0830 04/23/20 2117  Weight: 97.5 kg 97.5 kg 97.4 kg    Exam:  General: NAD   Cardiovascular: S1, S2 present  Respiratory: CTAB  Abdomen: Soft, nontender, nondistended, bowel sounds present  Musculoskeletal: Trace bilateral pedal edema noted  Skin: Normal  Psychiatry: Normal mood    Data Reviewed: CBC: Recent Labs  Lab 04/22/20 1836 04/22/20 1836 04/24/20 0252 04/25/20 0234 04/26/20 0239 04/27/20 0238 04/28/20 0159  WBC  6.4   < > 7.5 8.2 8.3 14.8* 12.5*  NEUTROABS 3.7  --   --  5.2 7.2 12.3* 10.7*  HGB 11.4*   < > 10.5* 9.9* 9.7* 9.1* 9.3*  HCT 36.5*   < > 31.8* 30.2* 28.5* 28.1* 28.2*  MCV 95.8   < > 91.4 91.0 90.2 93.4 92.8  PLT 167   < > 127* 124* 138* 168 192   < > = values in this interval not displayed.   Basic Metabolic Panel: Recent Labs  Lab 04/24/20 0252 04/25/20 0234 04/26/20 0239 04/27/20 0238 04/28/20 0159  NA 139 138 140 136 137  K 3.1* 2.9* 3.7 3.7 4.0  CL 105 103 105 103 104  CO2 27 26 23 22 22   GLUCOSE 95 97 135* 140* 145*  BUN 46* 45* 56* 66* 84*  CREATININE 4.73* 4.66* 4.75* 4.99* 5.11*  CALCIUM 8.1* 8.1* 8.2* 8.3* 8.4*  PHOS  --  5.2* 4.4 4.5 4.6   GFR: Estimated Creatinine Clearance: 15.8 mL/min (A) (by C-G formula based on SCr of 5.11 mg/dL (H)). Liver Function Tests: Recent Labs  Lab 04/22/20 1836 04/25/20 0234 04/26/20 0239 04/27/20 0238 04/28/20 0159  AST 22  --   --   --   --   ALT 22  --   --   --   --   ALKPHOS 70  --   --   --   --   BILITOT 1.1  --   --   --   --   PROT 7.7  --   --   --   --   ALBUMIN 2.7* 2.1* 2.0* 2.1* 2.1*   No results for input(s): LIPASE, AMYLASE in the last 168 hours. No results for input(s): AMMONIA in the last 168 hours. Coagulation Profile: Recent Labs  Lab 04/24/20 0252  INR 1.1   Cardiac Enzymes: Recent Labs  Lab 04/25/20 0234  CKTOTAL 81   BNP (last 3 results) No results for input(s): PROBNP in the last 8760 hours. HbA1C: No results for input(s): HGBA1C in the last 72 hours. CBG: No results for input(s): GLUCAP in the last 168 hours. Lipid Profile: No results for input(s): CHOL, HDL, LDLCALC, TRIG, CHOLHDL, LDLDIRECT in the last 72 hours. Thyroid Function Tests: No results for input(s): TSH, T4TOTAL, FREET4, T3FREE, THYROIDAB in the last 72 hours. Anemia Panel: No results for input(s): VITAMINB12, FOLATE, FERRITIN, TIBC, IRON, RETICCTPCT in the last 72 hours. Urine analysis:    Component Value Date/Time    COLORURINE YELLOW 04/22/2020 1830   APPEARANCEUR HAZY (A) 04/22/2020 1830   LABSPEC 1.011 04/22/2020 1830   PHURINE 6.0 04/22/2020 1830   GLUCOSEU 50 (A) 04/22/2020 1830   HGBUR LARGE (A) 04/22/2020 1830   BILIRUBINUR NEGATIVE 04/22/2020 1830   [place] 04/22/2020 1830   PROTEINUR >=300 (A) 04/22/2020 1830   NITRITE NEGATIVE 04/22/2020 1830  LEUKOCYTESUR SMALL (A) 04/22/2020 1830   Sepsis Labs: @LABRCNTIP (procalcitonin:4,lacticidven:4)  ) Recent Results (from the past 240 hour(s))  SARS Coronavirus 2 by RT PCR (hospital order, performed in [facility] hospital lab) Nasopharyngeal Nasopharyngeal Swab     Status: None   Collection Time: 04/23/20  7:15 AM   Specimen: Nasopharyngeal Swab  Result Value Ref Range Status   SARS Coronavirus 2 NEGATIVE NEGATIVE Final    Comment: (NOTE) SARS-CoV-2 target nucleic acids are NOT DETECTED.  The SARS-CoV-2 RNA is generally detectable in upper and lower respiratory specimens during the acute phase of infection. The lowest concentration of SARS-CoV-2 viral copies this assay can detect is 250 copies / mL. A negative result does not preclude SARS-CoV-2 infection and should not be used as the sole basis for treatment or other patient management decisions.  A negative result may occur with improper specimen collection / handling, submission of specimen other than nasopharyngeal swab, presence of viral mutation(s) within the areas targeted by this assay, and inadequate number of viral copies (<250 copies / mL). A negative result must be combined with clinical observations, patient history, and epidemiological information.  Fact Sheet for Patients:   StrictlyIdeas.no  Fact Sheet for Healthcare Providers: BankingDealers.co.za  This test is not yet approved or  cleared by the [place] FDA and has been authorized for detection and/or diagnosis of SARS-CoV-2 by FDA under an Emergency  Use Authorization (EUA).  This EUA will remain in effect (meaning this test can be used) for the duration of the COVID-19 declaration under Section 564(b)(1) of the Act, 21 U.S.C. section 360bbb-3(b)(1), unless the authorization is terminated or revoked sooner.  Performed at [facility], [place] [address], [place], [place] [address]   MRSA PCR Screening     Status: None   Collection Time: 04/23/20  9:31 PM   Specimen: Nasopharyngeal  Result Value Ref Range Status   MRSA by PCR NEGATIVE NEGATIVE Final    Comment:        The GeneXpert MRSA Assay (FDA approved for NASAL specimens only), is one component of a comprehensive MRSA colonization surveillance program. It is not intended to diagnose MRSA infection nor to guide or monitor treatment for MRSA infections. Performed at [facility], [place] [address] [place],  [address]       Studies: No results found.  Scheduled Meds: . amLODipine  10 mg Oral Daily  . aspirin EC  81 mg Oral QODAY  . atorvastatin  10 mg Oral Daily  . carvedilol  12.5 mg Oral BID WC  . enoxaparin (LOVENOX) injection  30 mg Subcutaneous Q24H  . fluticasone furoate-vilanterol  1 puff Inhalation Daily  . pantoprazole  40 mg Oral Daily  . predniSONE  60 mg Oral Daily    Continuous Infusions:    LOS: 5 days     Alma Friendly, MD [facility]  If 7PM-7AM, please contact night-coverage [email or web address] 04/28/2020, 4:33 PM

## 2020-04-28 NOTE — Plan of Care (Signed)
  Problem: Education: Goal: Knowledge of General Education information will improve Description: Including pain rating scale, medication(s)/side effects and non-pharmacologic comfort measures Outcome: Progressing   Problem: Clinical Measurements: Goal: Ability to maintain clinical measurements within normal limits will improve Outcome: Progressing Goal: Will remain free from infection Outcome: Progressing   Problem: Nutrition: Goal: Adequate nutrition will be maintained Outcome: Progressing   Problem: Elimination: Goal: Will not experience complications related to urinary retention Outcome: Progressing   Problem: Pain Managment: Goal: General experience of comfort will improve Outcome: Progressing   Problem: Safety: Goal: Ability to remain free from injury will improve Outcome: Progressing

## 2020-04-28 NOTE — Progress Notes (Signed)
Nutrition Education Note  RD consulted for nutrition education regarding AKI.  Discussed importance of decreasing sodium intake to manage HTN.   RD provided "Low Sodium Nutrition Therapy" handout from the Academy of Nutrition and Dietetics. Reviewed patient's dietary recall. Provided examples on ways to decrease sodium intake in diet. Discouraged intake of processed foods and use of salt shaker. Encouraged fresh fruits and vegetables as well as whole grain sources of carbohydrates to maximize fiber intake.   RD discussed why it is important for patient to adhere to diet recommendations, and emphasized the role of fluids, foods to avoid, and importance of weighing self daily. Teach back method used.  Pt endorses having great appetite PTA. Typically eats three meals daily that consist of lobster tail, shrimp, prime rib. Uses excessive [place] seasoning and other high sodium seasoning. Discussed ways to make substitutions. Pt seems very motivated. Answer all questions.   Expect good compliance.  Current diet order is renal, patient is consuming approximately 70-90% of meals at this time. Labs and medications reviewed. No further nutrition interventions warranted at this time. RD contact information provided. If additional nutrition issues arise, please re-consult RD.    Mariana Single RD, LDN Clinical Nutrition Pager listed in [place]

## 2020-04-29 DIAGNOSIS — I10 Essential (primary) hypertension: Secondary | ICD-10-CM | POA: Diagnosis not present

## 2020-04-29 DIAGNOSIS — N179 Acute kidney failure, unspecified: Secondary | ICD-10-CM | POA: Diagnosis not present

## 2020-04-29 DIAGNOSIS — E785 Hyperlipidemia, unspecified: Secondary | ICD-10-CM | POA: Diagnosis not present

## 2020-04-29 LAB — RENAL FUNCTION PANEL
Albumin: 2.3 g/dL — ABNORMAL LOW (ref 3.5–5.0)
Anion gap: 10 (ref 5–15)
BUN: 91 mg/dL — ABNORMAL HIGH (ref 8–23)
CO2: 23 mmol/L (ref 22–32)
Calcium: 8.4 mg/dL — ABNORMAL LOW (ref 8.9–10.3)
Chloride: 102 mmol/L (ref 98–111)
Creatinine, Ser: 4.48 mg/dL — ABNORMAL HIGH (ref 0.61–1.24)
GFR calc Af Amer: 14 mL/min — ABNORMAL LOW (ref >60)
GFR calc non Af Amer: 12 mL/min — ABNORMAL LOW (ref >60)
Glucose, Bld: 128 mg/dL — ABNORMAL HIGH (ref 70–99)
Phosphorus: 4.6 mg/dL (ref 2.5–4.6)
Potassium: 3.8 mmol/L (ref 3.5–5.1)
Sodium: 135 mmol/L (ref 135–145)

## 2020-04-29 LAB — CBC WITH DIFFERENTIAL/PLATELET
Abs Immature Granulocytes: 0.06 10*3/uL (ref 0.00–0.07)
Basophils Absolute: 0 10*3/uL (ref 0.0–0.1)
Basophils Relative: 0 %
Eosinophils Absolute: 0 10*3/uL (ref 0.0–0.5)
Eosinophils Relative: 0 %
HCT: 27.2 % — ABNORMAL LOW (ref 39.0–52.0)
Hemoglobin: 8.8 g/dL — ABNORMAL LOW (ref 13.0–17.0)
Immature Granulocytes: 1 %
Lymphocytes Relative: 10 %
Lymphs Abs: 0.9 10*3/uL (ref 0.7–4.0)
MCH: 29.9 pg (ref 26.0–34.0)
MCHC: 32.4 g/dL (ref 30.0–36.0)
MCV: 92.5 fL (ref 80.0–100.0)
Monocytes Absolute: 0.5 10*3/uL (ref 0.1–1.0)
Monocytes Relative: 5 %
Neutro Abs: 7.7 10*3/uL (ref 1.7–7.7)
Neutrophils Relative %: 84 %
Platelets: 238 10*3/uL (ref 150–400)
RBC: 2.94 MIL/uL — ABNORMAL LOW (ref 4.22–5.81)
RDW: 13.3 % (ref 11.5–15.5)
WBC: 9.1 10*3/uL (ref 4.0–10.5)
nRBC: 0 % (ref 0.0–0.2)

## 2020-04-29 LAB — ANTI-DNASE B ANTIBODY: Anti-DNAse-B: 1660 U/mL — ABNORMAL HIGH (ref 0–120)

## 2020-04-29 MED ORDER — PREDNISONE 20 MG PO TABS: 60.0000 mg | ORAL_TABLET | Freq: Every day | ORAL | 0 refills | Status: AC

## 2020-04-29 MED ORDER — CARVEDILOL 12.5 MG PO TABS: 12.5000 mg | ORAL_TABLET | Freq: Two times a day (BID) | ORAL | 0 refills | Status: DC

## 2020-04-29 MED ORDER — NEPRO/CARBSTEADY PO LIQD: 237.0000 mL | Freq: Three times a day (TID) | ORAL | 0 refills | Status: AC | PRN

## 2020-04-29 MED ORDER — PANTOPRAZOLE SODIUM 40 MG PO TBEC: 40.0000 mg | DELAYED_RELEASE_TABLET | Freq: Every day | ORAL | 0 refills | Status: DC

## 2020-04-29 NOTE — Discharge Summary (Signed)
Discharge Summary  Paul Donaldson HCW:[id number] DOB: 08-13-1951  PCP: Bartholome Bill, MD  Admit date: 04/22/2020 Discharge date: 04/29/2020  Time spent: 40 mins  Recommendations for Outpatient Follow-up:  1. PCP in 1 week 2. Nephrology as scheduled   Discharge Diagnoses:  Active Hospital Problems   Diagnosis Date Noted  . AKI (acute kidney injury) ([place]) 04/23/2020  . Dyslipidemia   . Hypertension     Resolved Hospital Problems  No resolved problems to display.    Discharge Condition: Stable  Diet recommendation: Renal diet  Vitals:   04/29/20 0911 04/29/20 1137  BP:  (!) 172/86  Pulse:    Resp:  18  Temp:  98.1 F (36.7 C)  SpO2: 96% 97%    History of present illness:  Paul Donaldson a 69 y.o.malewith medical history significant ofHTN presenting with AKI.He reports that he has been feeling weak and anorexic for several weeks. He was initially seen on 7/6 at the [facility] ER for similar symptoms; he was diagnosed with a UTI and given Keflex. He took the Keflex but has not had improvement in his symptoms and has noticed worsening BP elevation as well as brownish colored urine. He went to his PCP, who attempted to arrange for nephrology evaluation and was ultimately told to come to the ER. In the ED, Cr 1.7 ->3.7. Nephrology consulted. Pt admitted for further management.   Today, patient denies any new complaints, very eager to be discharged.  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  Continue prednisone Follow-up with nephrology  Leukocytosis Likely 2/2 steroids, afebrile  Hypertension BP stable Continue Norvasc, recently started Coreg 12.5 mg twice daily  Hypokalemia Replace as needed  Normocytic anemia Anemia panel showed iron 66, sats 40, ferritin 562 Follow-up with nephrology/PCP  Hyperlipidemia Continue Lipitor  Asthma Continue inhaler       Malnutrition Type:      Malnutrition Characteristics:      Nutrition Interventions:      Estimated body mass index is 26.84 kg/m as calculated from the following:   Height as of this encounter: 6\' 3"  (1.905 m).   Weight as of this encounter: 97.4 kg.    Procedures:  Renal biopsy  Consultations:  Nephrology  IR  Discharge Exam: BP (!) 172/86 (BP Location: Left Arm)   Pulse 81   Temp 98.1 F (36.7 C) (Oral)   Resp 18   Ht 6\' 3"  (1.905 m)   Wt 97.4 kg   SpO2 97%   BMI 26.84 kg/m   General: NAD Cardiovascular: S1, S2 present Respiratory: CTAB   Discharge Instructions You were cared for by a hospitalist during your hospital stay. If you have any questions about your discharge medications or the care you received while you were in the hospital after you are discharged, you can call the unit and asked to speak with the  hospitalist on call if the hospitalist that took care of you is not available. Once you are discharged, your primary care physician will handle any further medical issues. Please note that NO REFILLS for any discharge medications will be authorized once you are discharged, as it is imperative that you return to your primary care physician (or establish a relationship with a primary  care physician if you do not have one) for your aftercare needs so that they can reassess your need for medications and monitor your lab values.  Discharge Instructions    Diet - low sodium heart healthy   Complete by: As directed    Increase activity slowly   Complete by: As directed    No wound care   Complete by: As directed      Allergies as of 04/29/2020   No Known Allergies     Medication List    TAKE these medications   amLODipine 10 MG tablet Commonly known as: NORVASC Take 10 mg by mouth daily.   aspirin 81 MG tablet Take 81 mg by mouth every other day.   atorvastatin 10 MG tablet Commonly known as: LIPITOR Take 10 mg by mouth daily.   Breo Ellipta 200-25 MCG/INH Aepb Generic drug: fluticasone furoate-vilanterol Inhale 1 puff into the lungs daily.   carvedilol 12.5 MG tablet Commonly known as: COREG Take 1 tablet (12.5 mg total) by mouth 2 (two) times daily with a meal.   feeding supplement (NEPRO CARB STEADY) Liqd Take 237 mLs by mouth 3 (three) times daily as needed (Supplement).   ondansetron 4 MG tablet Commonly known as: ZOFRAN Take 4 mg by mouth every 8 (eight) hours as needed for nausea/vomiting.   pantoprazole 40 MG tablet Commonly known as: PROTONIX Take 1 tablet (40 mg total) by mouth daily. Start taking on: April 30, 2020   polyvinyl alcohol 1.4 % ophthalmic solution Commonly known as: LIQUIFILM TEARS Place 1 drop into both eyes as needed for dry eyes.   predniSONE 20 MG tablet Commonly known as: DELTASONE Take 3 tablets (60 mg total) by mouth daily. Start taking on: April 30, 2020      No Known Allergies    The results of significant diagnostics from this hospitalization (including imaging, microbiology, ancillary and laboratory) are listed below for reference.    Significant Diagnostic Studies: CT ABDOMEN PELVIS WO CONTRAST  Result Date: 04/23/2020 CLINICAL DATA:  Hematuria.  Significantly worsened renal function. EXAM: CT  ABDOMEN AND PELVIS WITHOUT CONTRAST TECHNIQUE: Multidetector CT imaging of the abdomen and pelvis was performed following the standard protocol without IV contrast. COMPARISON:  05/15/2014 CT abdomen report (images not available) FINDINGS: Lower chest: Small right pleural effusion.  Bibasilar atelectasis. Hepatobiliary: An approximately 8.6 cm hypoattenuating mass in the posterior right hepatic lobe likely corresponds to the benign giant hemangioma described on the prior CT. The gallbladder is unremarkable. There is no biliary dilatation. Pancreas: Unremarkable. Spleen: Unremarkable. Adrenals/Urinary Tract: Unremarkable adrenal glands. Mild-to-moderate symmetric perinephric stranding bilaterally. No renal calculi or hydronephrosis. 5.8 cm low-density lesion arising from the lower pole of the left kidney compatible with a cyst. Nondistended bladder. Stomach/Bowel: The stomach is collapsed. There is no evidence of bowel obstruction or inflammation. The appendix is unremarkable. Vascular/Lymphatic: Mild abdominal aortic atherosclerosis without aneurysm. No enlarged lymph nodes. Reproductive: Mildly enlarged prostate. Other: No intraperitoneal free fluid or free air. Small fat containing umbilical hernia. Musculoskeletal: No acute osseous abnormality or suspicious osseous lesion. Moderate disc degeneration at L4-5 and L5-S1. IMPRESSION: 1. Nonspecific  bilateral perinephric stranding. No urinary tract calculi or hydronephrosis. 2. Small right pleural effusion. 3. Known giant hepatic hemangioma. 4. Aortic Atherosclerosis (ICD10-I70.0). Electronically Signed   By: Logan Bores M.D.   On: 04/23/2020 06:06   US BIOPSY (KIDNEY)  Result Date: 04/24/2020 INDICATION: 70 year old male with a history of chronic renal disease, proteinuria, referred for biopsy EXAM: IMAGE GUIDED BIOPSY LEFT KIDNEY FOR MEDICAL RENAL PURPOSE MEDICATIONS: None. ANESTHESIA/SEDATION: Moderate (conscious) sedation was employed during this procedure. A  total of Versed 1.0 mg and Fentanyl 50 mcg was administered intravenously. Moderate Sedation Time: 11 minutes. The patient's level of consciousness and vital signs were monitored continuously by radiology nursing throughout the procedure under my direct supervision. FLUOROSCOPY TIME:  None COMPLICATIONS: None PROCEDURE: Informed written consent was obtained from the patient after a thorough discussion of the procedural risks, benefits and alternatives. All questions were addressed. Maximal Sterile Barrier Technique was utilized including caps, mask, sterile gowns, sterile gloves, sterile drape, hand hygiene and skin antiseptic. A timeout was performed prior to the initiation of the procedure. Patient was positioned prone position on the gantry table. Images were stored sent to PACs. Once the patient is prepped and draped in the usual sterile fashion, the skin and subcutaneous tissues overlying the left kidney were generously infiltrated 1% lidocaine for local anesthesia. Using ultrasound guidance, a 15 gauge guide needle was advanced into the lower lateral cortex of the left kidney. Once we confirmed location of the needle tip, 2 separate 16 gauge core biopsy were achieved. Two Gel-Foam pledgets were infused with a small amount of saline. The needle was removed. Final images were stored. The patient tolerated the procedure well and remained hemodynamically stable throughout. No complications were encountered and no significant blood loss encountered. IMPRESSION: Status post image guided medical renal biopsy. Signed, Dulcy Fanny. Dellia Nims, RPVI [facility] Electronically Signed   By: Corrie Mckusick D.O.   On: 04/24/2020 09:38   VAS US RENAL ARTERY DUPLEX  Result Date: 04/23/2020 ABDOMINAL VISCERAL Indications: Worsening AKI Limitations: Patient unable to hold breath/stay awake during exam. Comparison Study: No prior studies. Performing Technologist: Darlin Coco, RDMS  Examination Guidelines: A complete evaluation includes B-mode imaging, spectral Doppler, color Doppler, and power Doppler as needed of all accessible portions of each vessel. Bilateral testing is considered an integral part of a complete examination. Limited examinations for reoccurring indications may be performed as noted.  Duplex Findings: +----------------------+--------+--------+------+--------+ Mesenteric            PSV cm/sEDV cm/sPlaqueComments +----------------------+--------+--------+------+--------+ Aorta Mid               106      24                  +----------------------+--------+--------+------+--------+ Celiac Artery Origin    146      38                  +----------------------+--------+--------+------+--------+ Celiac Artery Proximal  173      48                  +----------------------+--------+--------+------+--------+ SMA Origin              194      20                  +----------------------+--------+--------+------+--------+    +------------------+--------+--------+-------+ Right Renal ArteryPSV cm/sEDV cm/sComment +------------------+--------+--------+-------+ Origin  75      16           +------------------+--------+--------+-------+ Proximal             99      20           +------------------+--------+--------+-------+ Mid                 107      21           +------------------+--------+--------+-------+ Distal               66      15           +------------------+--------+--------+-------+ +-----------------+--------+--------+-------+ Left Renal ArteryPSV cm/sEDV cm/sComment +-----------------+--------+--------+-------+ Origin              81      15           +-----------------+--------+--------+-------+ Proximal            82      15           +-----------------+--------+--------+-------+ Mid                 81      15           +-----------------+--------+--------+-------+ Distal               91      18           +-----------------+--------+--------+-------+ +------------+--------+--------+----+-----------+--------+--------+----+ Right KidneyPSV cm/sEDV cm/sRI  Left KidneyPSV cm/sEDV cm/sRI   +------------+--------+--------+----+-----------+--------+--------+----+ Upper Pole  58      13      0.78Upper Pole 63      12      0.80 +------------+--------+--------+----+-----------+--------+--------+----+ Mid         52      10      0.81Mid        46      9       0.81 +------------+--------+--------+----+-----------+--------+--------+----+ Lower Pole  64      16      0.75Lower Pole 32      10      0.68 +------------+--------+--------+----+-----------+--------+--------+----+ Hilar       59      11      0.81Hilar                           +------------+--------+--------+----+-----------+--------+--------+----+ +------------------+-----+------------------+-----+ Right Kidney           Left Kidney             +------------------+-----+------------------+-----+ RAR                    RAR                     +------------------+-----+------------------+-----+ RAR (manual)           RAR (manual)            +------------------+-----+------------------+-----+ Cortex                 Cortex                  +------------------+-----+------------------+-----+ Cortex thickness       Corex thickness         +------------------+-----+------------------+-----+ Kidney length (cm)12.90Kidney length (cm)12.50 +------------------+-----+------------------+-----+  Summary: Renal:  Right: Abnormal right Resistive Index. Normal size right kidney. Left:  Abnormal left Resisitve Index. Normal size of left  kidney.        Cyst(s) noted.  *See table(s) above for measurements and observations.  Diagnosing physician: Ruta Hinds MD  Electronically signed by Ruta Hinds MD on 04/23/2020 at 6:13:10 PM.    Final     Microbiology: Recent Results  (from the past 240 hour(s))  SARS Coronavirus 2 by RT PCR (hospital order, performed in [facility] hospital lab) Nasopharyngeal Nasopharyngeal Swab     Status: None   Collection Time: 04/23/20  7:15 AM   Specimen: Nasopharyngeal Swab  Result Value Ref Range Status   SARS Coronavirus 2 NEGATIVE NEGATIVE Final    Comment: (NOTE) SARS-CoV-2 target nucleic acids are NOT DETECTED.  The SARS-CoV-2 RNA is generally detectable in upper and lower respiratory specimens during the acute phase of infection. The lowest concentration of SARS-CoV-2 viral copies this assay can detect is 250 copies / mL. A negative result does not preclude SARS-CoV-2 infection and should not be used as the sole basis for treatment or other patient management decisions.  A negative result may occur with improper specimen collection / handling, submission of specimen other than nasopharyngeal swab, presence of viral mutation(s) within the areas targeted by this assay, and inadequate number of viral copies (<250 copies / mL). A negative result must be combined with clinical observations, patient history, and epidemiological information.  Fact Sheet for Patients:   StrictlyIdeas.no  Fact Sheet for Healthcare Providers: BankingDealers.co.za  This test is not yet approved or  cleared by the [place] FDA and has been authorized for detection and/or diagnosis of SARS-CoV-2 by FDA under an Emergency Use Authorization (EUA).  This EUA will remain in effect (meaning this test can be used) for the duration of the COVID-19 declaration under Section 564(b)(1) of the Act, 21 U.S.C. section 360bbb-3(b)(1), unless the authorization is terminated or revoked sooner.  Performed at [facility], [place] [address], [place], [place] [address]   MRSA PCR Screening     Status: None   Collection Time: 04/23/20  9:31 PM   Specimen: Nasopharyngeal  Result Value Ref Range Status    MRSA by PCR NEGATIVE NEGATIVE Final    Comment:        The GeneXpert MRSA Assay (FDA approved for NASAL specimens only), is one component of a comprehensive MRSA colonization surveillance program. It is not intended to diagnose MRSA infection nor to guide or monitor treatment for MRSA infections. Performed at [facility], [place] [address] [place], [place] [address]      Labs: Basic Metabolic Panel: Recent Labs  Lab 04/25/20 0234 04/26/20 0239 04/27/20 0238 04/28/20 0159 04/29/20 0115  NA 138 140 136 137 135  K 2.9* 3.7 3.7 4.0 3.8  CL 103 105 103 104 102  CO2 26 23 22 22 23   GLUCOSE 97 135* 140* 145* 128*  BUN 45* 56* 66* 84* 91*  CREATININE 4.66* 4.75* 4.99* 5.11* 4.48*  CALCIUM 8.1* 8.2* 8.3* 8.4* 8.4*  PHOS 5.2* 4.4 4.5 4.6 4.6   Liver Function Tests: Recent Labs  Lab 04/22/20 1836 04/22/20 1836 04/25/20 0234 04/26/20 0239 04/27/20 0238 04/28/20 0159 04/29/20 0115  AST 22  --   --   --   --   --   --   ALT 22  --   --   --   --   --   --   ALKPHOS 70  --   --   --   --   --   --  BILITOT 1.1  --   --   --   --   --   --   PROT 7.7  --   --   --   --   --   --   ALBUMIN 2.7*   < > 2.1* 2.0* 2.1* 2.1* 2.3*   < > = values in this interval not displayed.   No results for input(s): LIPASE, AMYLASE in the last 168 hours. No results for input(s): AMMONIA in the last 168 hours. CBC: Recent Labs  Lab 04/25/20 0234 04/26/20 0239 04/27/20 0238 04/28/20 0159 04/29/20 0115  WBC 8.2 8.3 14.8* 12.5* 9.1  NEUTROABS 5.2 7.2 12.3* 10.7* 7.7  HGB 9.9* 9.7* 9.1* 9.3* 8.8*  HCT 30.2* 28.5* 28.1* 28.2* 27.2*  MCV 91.0 90.2 93.4 92.8 92.5  PLT 124* 138* 168 192 238   Cardiac Enzymes: Recent Labs  Lab 04/25/20 0234  CKTOTAL 81   BNP: BNP (last 3 results) No results for input(s): BNP in the last 8760 hours.  ProBNP (last 3 results) No results for input(s): PROBNP in the last 8760 hours.  CBG: No results for input(s): GLUCAP in the last 168  hours.     Signed:  Alma Friendly, MD [facility] 04/29/2020, 12:03 PM

## 2020-04-29 NOTE — Progress Notes (Signed)
Pt adamant about being discharged advise patient at start of shift once MD came in d/c process will take sometime.  Advise may be between 11-12 before discharge complete based on MD's schedule.    @1030  Pt called RN in room to see why had not been discharged. RN advise pt MD had not put d/c orders in yet. Pt advise spoke with Dr. Joelyn Oms and he was suppose to be d/c'd.  Re-educated pt of d/c process all documentation had to be update by MD's and then RN would complete paperwork.   @1135  RN received second call from secretary advise patient requesting why discharge had not happened.

## 2020-04-29 NOTE — Progress Notes (Addendum)
RN provided pt with verbal discharge instructions. Paper copy provided to patient at d/c. RN answered all questions.  IV removed. VSS at discharge.  Pt belonging sent with patient. Patient discharge by NT via wheelchair to [employer] entrance to private vehicle.

## 2020-04-29 NOTE — Progress Notes (Signed)
[facility] Progress Note    Assessment/ Plan:   1. Acute kidney Injury, etiology initially unclear urine sediment does reveal large amounts of blood and has a urine protein creatinine ratio of 8.23 g. Seems like his baseline cr is around 1.3 per careeverywhere in epic. 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 Intense inflammation with very little signs of permanent destruction -started prednisone 60mg  daily (7/23), plan for 6 month regimen for now. Until final pathology has resulted, will make a decision on further immunosuppression if indicated (I.e. if actual c3 glomerulopathy then will trial MMF 1g BID for ~6 months). GI ppx: protonix - Discussed prelim results with Dr. Donald Prose from [place], awaiting final path and report -C3 low, C4 wnl, CH50 low -Hepatitis panel, HIV negative -interestingly his ASO titers came back and are elevated 1,383 and DNAseB was positive also (other than a recent UTI there were no other infectious symptoms I.e. skin infections or URI recently). 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 Candiss Norse.  Appointments will be made and we will contact him.  2. Nephrotic range proteinuria, nephrotic syndrome, as above -volume status acceptable (suprisingly not significantly volume up). Hold on starting anticoagulation for now unless persistently nephrotic. S/p renal bx 7/22 -repeat UPC in about 3-4 weeks to ensure improvement 3. Hypokalemia, replete prn 4. HTN  - CTM Carvedilol and AMlodipine 5. Leukocytosis without fever, likely secondary to steroids    Subjective:     NAEO  Feels better  Creatinine downtrending, electrolytes stable  On Pred 60mg /d  DNAseB  was positive   Objective:   BP (!) 172/86 (BP Location: Left Arm)   Pulse 81   Temp 98.1 F (36.7 C) (Oral)   Resp 18   Ht 6\' 3"  (1.905 m)   Wt 97.4 kg   SpO2 97%   BMI 26.84 kg/m   Intake/Output Summary (Last 24 hours) at 04/29/2020 1244 Last data filed at 04/29/2020 0900 Gross per 24 hour  Intake 360 ml  Output 425 ml  Net -65 ml   Weight change:   Physical Exam: Gen: nad, laying flat in bed CVS: s1s2, rrr, no m/r/g Resp:cta bl, no w/r/r/c, unlabored, bl chest expansion Abd: soft, nt/nd Ext: trace pedal edema Neuro: aaox3, no focal deficits, speech clear and coherent  Imaging: No results found.  Labs: BMET Recent Labs  Lab 04/22/20 1836 04/24/20 0252 04/25/20 0234 04/26/20 0239 04/27/20 0238 04/28/20 0159 04/29/20 0115  NA 141 139 138 140 136 137 135  K 3.3* 3.1* 2.9* 3.7 3.7 4.0 3.8  CL 107 105 103 105 103 104 102  CO2 23 27 26 23 22 22 23   GLUCOSE 88 95 97 135* 140* 145* 128*  BUN 50* 46* 45* 56* 66* 84* 91*  CREATININE 4.74* 4.73* 4.66* 4.75* 4.99* 5.11* 4.48*  CALCIUM 8.3* 8.1* 8.1* 8.2* 8.3* 8.4* 8.4*  PHOS  --   --  5.2* 4.4 4.5 4.6 4.6   CBC Recent Labs  Lab 04/26/20 0239 04/27/20 0238 04/28/20 0159 04/29/20 0115  WBC 8.3 14.8* 12.5* 9.1  NEUTROABS 7.2 12.3* 10.7* 7.7  HGB 9.7* 9.1* 9.3* 8.8*  HCT 28.5* 28.1* 28.2* 27.2*  MCV 90.2 93.4 92.8 92.5  PLT 138* 168 192 238    Medications:    .  amLODipine  10 mg Oral Daily  . aspirin EC  81 mg Oral QODAY  . atorvastatin  10 mg Oral Daily  . carvedilol  12.5 mg Oral BID WC  . enoxaparin (LOVENOX) injection  30 mg Subcutaneous Q24H  . fluticasone furoate-vilanterol  1 puff Inhalation Daily  . pantoprazole  40 mg Oral Daily  . predniSONE  60 mg Oral Daily      Gean Quint, MD [facility] 04/29/2020, 12:44 PM

## 2020-04-30 LAB — SURGICAL PATHOLOGY

## 2020-06-03 ENCOUNTER — Ambulatory Visit: Payer: Medicare Other | Admitting: Internal Medicine

## 2020-06-05 ENCOUNTER — Ambulatory Visit: Payer: Medicare Other | Admitting: Internal Medicine

## 2020-06-23 ENCOUNTER — Other Ambulatory Visit: Payer: Self-pay

## 2020-06-23 ENCOUNTER — Ambulatory Visit (INDEPENDENT_AMBULATORY_CARE_PROVIDER_SITE_OTHER): Payer: Medicare Other | Admitting: Infectious Disease

## 2020-06-23 ENCOUNTER — Encounter: Payer: Self-pay | Admitting: Infectious Disease

## 2020-06-23 VITALS — BP 195/98 | HR 90 | Temp 98.0°F | Resp 16 | Wt 211.0 lb

## 2020-06-23 DIAGNOSIS — B829 Intestinal parasitism, unspecified: Secondary | ICD-10-CM

## 2020-06-23 DIAGNOSIS — R21 Rash and other nonspecific skin eruption: Secondary | ICD-10-CM

## 2020-06-23 DIAGNOSIS — R600 Localized edema: Secondary | ICD-10-CM | POA: Diagnosis not present

## 2020-06-23 DIAGNOSIS — N059 Unspecified nephritic syndrome with unspecified morphologic changes: Secondary | ICD-10-CM

## 2020-06-23 HISTORY — DX: Localized edema: R60.0

## 2020-06-23 HISTORY — DX: Unspecified nephritic syndrome with unspecified morphologic changes: N05.9

## 2020-06-23 NOTE — Progress Notes (Signed)
Subjective:  Reasson for ID consult:   Requesting Physician: Dr. Candiss Norse   Patient ID: Paul Donaldson, male    DOB: 1951-01-01, 69 y.o.   MRN: [id number]  HPI  Mr. Kissinger is a 69 year old African-American man with history of hypertension asthma chronic kidney disease who was diagnosed with a postinfectious glomerular nephritis.  He had a biopsy performed which had showed a C3 dominant active diffuse proliferative glomerulonephritis with 10% small crushed cellular crescents thought to be consistent with a postinfectious glomerulonephritis.  This was in July 2020 second of 2021.  Initially apparently it was difficult to determine whether this was secondary to to a C3 glomerulopathy versus an acute postinfectious glomerular not nephropathy.  Steroids were started initially and there was not improvement in renal function however creatinine has stabilized since then.  The final pathology report showed a pattern that was more consistent with a acute postinfectious glomerulonephritis especially in the context of laboratory data showing elevated anti-DNA's elevated ASO titers.  He had had a sore throat in the last 2 months but was not diagnosed with streptococcal pharyngitis the does not have a cellulitis although he does have problems with chronic dermatitis.  He apparently had been diagnosed with a urinary tract infection treated with Keflex.  He was referred to [place] by Dr. Candiss Norse to evaluate a possible infectious cause of his postinfectious [place] nephritis.  This is very difficult for [place] to do.  When I talked to the patient today who came with his fiance both he understands I were quite fixated on the idea that the patient had a parasitic infection.  He showed me several pictures of loose stools that he had which he felt showed worms in them.  His fiance also think she is infected with worms.  In talking to them I cannot get much of a history to suggest why the either one of them would have a parasitic  infection but I told him I was willing to analyze his stool for ova and parasites.  He also gives 0 history to suggest infection with strongyloidiasis but I will check a strongyloidiasis antibody nonetheless.  In terms of other infectious diseases that are chronic and treatable his labs while he is in the hospital show that he did not have HIV that he did not have hepatitis C or B.  Apparently his kidney function has been improving and he is scheduled to see his nephrologist shortly.  He also is wondering why he has had lower extremity edema in both he and his fiance again returned to the idea of a parasitic infection.  I told him that a parasitic infection did not fit in very well with his history and also certainly not with a postinfectious glomerulonephritis.  I am very skeptical of this diagnosis but I am quite willing to work him up for parasitic infection.  I do not see great deal of reason to work-up the cause of his postinfectious glomerular nephritis since it seems more likely to be consistent with a streptococcal infection that is likely long occurred and not in need of treatment at present.     Past Medical History:  Diagnosis Date  . Asthma   . Colon polyp    TUBULAR ADENOMA (X1)  . Dyslipidemia   . Hypertension   . Lower extremity edema 06/23/2020  . 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    Past Surgical History:  Procedure Laterality Date  . COLONOSCOPY W/ BIOPSIES  2015   TUBULAR ADENOMA (X1).  [facility]  CYST EXCISION  1969   back    Family History  Problem Relation Age of Onset  . Colon cancer Neg Hx   . Esophageal cancer Neg Hx   . Rectal cancer Neg Hx   . Stomach cancer Neg Hx       Social History   Socioeconomic History  . Marital status: Single    Spouse name: Not on file  . Number of children: Not on file  . Years of education: Not on file  . Highest education level: Not on file  Occupational History  . Occupation: retired  Tobacco Use  .  Smoking status: Former Smoker    Quit date: 09/10/1996    Years since quitting: 23.8  . Smokeless tobacco: Never Used  Substance and Sexual Activity  . Alcohol use: No  . Drug use: No  . Sexual activity: Not on file  Other Topics Concern  . Not on file  Social History Narrative  . Not on file   Social Determinants of Health   Financial Resource Strain:   . Difficulty of Paying Living Expenses: Not on file  Food Insecurity:   . Worried About Charity fundraiser in the Last Year: Not on file  . Ran Out of Food in the Last Year: Not on file  Transportation Needs:   . Lack of Transportation (Medical): Not on file  . Lack of Transportation (Non-Medical): Not on file  Physical Activity:   . Days of Exercise per Week: Not on file  . Minutes of Exercise per Session: Not on file  Stress:   . Feeling of Stress : Not on file  Social Connections:   . Frequency of Communication with Friends and Family: Not on file  . Frequency of Social Gatherings with Friends and Family: Not on file  . Attends Religious Services: Not on file  . Active Member of Clubs or Organizations: Not on file  . Attends Archivist Meetings: Not on file  . Marital Status: Not on file    No Known Allergies   Current Outpatient Medications:  .  amLODipine (NORVASC) 10 MG tablet, Take 10 mg by mouth daily., Disp: , Rfl:  .  aspirin 81 MG tablet, Take 81 mg by mouth every other day., Disp: , Rfl:  .  atorvastatin (LIPITOR) 10 MG tablet, Take 10 mg by mouth daily., Disp: , Rfl:  .  BREO ELLIPTA 200-25 MCG/INH AEPB, Inhale 1 puff into the lungs daily., Disp: , Rfl:  .  furosemide (LASIX) 20 MG tablet, Take 20 mg by mouth., Disp: , Rfl:  .  carvedilol (COREG) 12.5 MG tablet, Take 1 tablet (12.5 mg total) by mouth 2 (two) times daily with a meal., Disp: 60 tablet, Rfl: 0 .  ondansetron (ZOFRAN) 4 MG tablet, Take 4 mg by mouth every 8 (eight) hours as needed for nausea/vomiting. (Patient not taking: Reported on  06/23/2020), Disp: , Rfl:  .  pantoprazole (PROTONIX) 40 MG tablet, Take 1 tablet (40 mg total) by mouth daily., Disp: 30 tablet, Rfl: 0 .  polyvinyl alcohol (LIQUIFILM TEARS) 1.4 % ophthalmic solution, Place 1 drop into both eyes as needed for dry eyes. (Patient not taking: Reported on 06/23/2020), Disp: , Rfl:    Review of Systems  Constitutional: Negative for activity change, appetite change, chills, diaphoresis, fatigue, fever and unexpected weight change.  HENT: Negative for congestion, rhinorrhea, sinus pressure, sneezing, sore throat and trouble swallowing.   Eyes: Negative for photophobia and visual disturbance.  Respiratory: Positive for cough. Negative for chest tightness, shortness of breath, wheezing and stridor.   Cardiovascular: Positive for leg swelling. Negative for chest pain and palpitations.  Gastrointestinal: Positive for constipation and diarrhea. Negative for abdominal distention, abdominal pain, anal bleeding, blood in stool, nausea and vomiting.  Genitourinary: Negative for difficulty urinating, dysuria, flank pain and hematuria.  Musculoskeletal: Positive for myalgias. Negative for arthralgias, back pain, gait problem and joint swelling.  Skin: Positive for rash. Negative for color change, pallor and wound.  Neurological: Negative for dizziness, tremors, weakness and light-headedness.  Hematological: Negative for adenopathy. Does not bruise/bleed easily.  Psychiatric/Behavioral: Negative for agitation, behavioral problems, confusion, decreased concentration, dysphoric mood and sleep disturbance.       Objective:   Physical Exam Constitutional:      Appearance: He is well-developed.  HENT:     Head: Normocephalic and atraumatic.  Eyes:     Extraocular Movements: Extraocular movements intact.     Conjunctiva/sclera: Conjunctivae normal.  Cardiovascular:     Rate and Rhythm: Normal rate and regular rhythm.     Heart sounds: No murmur heard.  Friction rub present.  No gallop.   Pulmonary:     Effort: Pulmonary effort is normal. No respiratory distress.     Breath sounds: Normal breath sounds. No stridor. No wheezing or rhonchi.  Abdominal:     General: Bowel sounds are normal. There is no distension.     Palpations: Abdomen is soft.  Musculoskeletal:        General: No tenderness. Normal range of motion.     Cervical back: Normal range of motion and neck supple.  Skin:    General: Skin is warm and dry.     Coloration: Skin is not pale.     Findings: No erythema or rash.  Neurological:     General: No focal deficit present.     Mental Status: He is alert and oriented to person, place, and time.  Psychiatric:        Attention and Perception: Attention and perception normal.        Mood and Affect: Mood is depressed.        Speech: Speech normal.        Behavior: Behavior normal.        Cognition and Memory: Cognition and memory normal.    Dermatitis that is chronic      Assessment & Plan:  Post infectious glomerulonephritis: Labs seem consistent with prior streptococcal infection that precipitated this.  He is also negative for HIV hep B hep C.  Believes that he has a parasitic infection: I am very skeptical of this but will check a stool ova and parasite I note that his CBC did not show eosinophilia.  I will check a Strongyloides antibody though he also gives no history consistent with this.  Dermatitis would defer to dermatologyb and consider biopsy

## 2020-06-25 ENCOUNTER — Other Ambulatory Visit: Payer: Medicare Other

## 2020-06-25 ENCOUNTER — Other Ambulatory Visit: Payer: Self-pay

## 2020-06-25 DIAGNOSIS — B829 Intestinal parasitism, unspecified: Secondary | ICD-10-CM

## 2020-06-25 NOTE — Addendum Note (Signed)
Addended by: Caffie Pinto on: 06/25/2020 02:59 PM   Modules accepted: Orders

## 2020-06-28 LAB — CBC WITH DIFFERENTIAL/PLATELET
Absolute Monocytes: 533 cells/uL (ref 200–950)
Basophils Absolute: 19 cells/uL (ref 0–200)
Basophils Relative: 0.3 %
Eosinophils Absolute: 112 cells/uL (ref 15–500)
Eosinophils Relative: 1.8 %
HCT: 30.2 % — ABNORMAL LOW (ref 38.5–50.0)
Hemoglobin: 10.3 g/dL — ABNORMAL LOW (ref 13.2–17.1)
Lymphs Abs: 1215 cells/uL (ref 850–3900)
MCH: 32 pg (ref 27.0–33.0)
MCHC: 34.1 g/dL (ref 32.0–36.0)
MCV: 93.8 fL (ref 80.0–100.0)
MPV: 8.7 fL (ref 7.5–12.5)
Monocytes Relative: 8.6 %
Neutro Abs: 4321 cells/uL (ref 1500–7800)
Neutrophils Relative %: 69.7 %
Platelets: 215 10*3/uL (ref 140–400)
RBC: 3.22 10*6/uL — ABNORMAL LOW (ref 4.20–5.80)
RDW: 14.5 % (ref 11.0–15.0)
Total Lymphocyte: 19.6 %
WBC: 6.2 10*3/uL (ref 3.8–10.8)

## 2020-06-28 LAB — COMPLETE METABOLIC PANEL WITH GFR
AG Ratio: 1.1 (calc) (ref 1.0–2.5)
ALT: 17 U/L (ref 9–46)
AST: 16 U/L (ref 10–35)
Albumin: 3.3 g/dL — ABNORMAL LOW (ref 3.6–5.1)
Alkaline phosphatase (APISO): 54 U/L (ref 35–144)
BUN/Creatinine Ratio: 12 (calc) (ref 6–22)
BUN: 24 mg/dL (ref 7–25)
CO2: 26 mmol/L (ref 20–32)
Calcium: 9.3 mg/dL (ref 8.6–10.3)
Chloride: 103 mmol/L (ref 98–110)
Creat: 2.05 mg/dL — ABNORMAL HIGH (ref 0.70–1.18)
GFR, Est African American: 37 mL/min/{1.73_m2} — ABNORMAL LOW (ref >=60)
GFR, Est Non African American: 32 mL/min/{1.73_m2} — ABNORMAL LOW (ref >=60)
Globulin: 2.9 g/dL (calc) (ref 1.9–3.7)
Glucose, Bld: 82 mg/dL (ref 65–99)
Potassium: 3.8 mmol/L (ref 3.5–5.3)
Sodium: 136 mmol/L (ref 135–146)
Total Bilirubin: 0.4 mg/dL (ref 0.2–1.2)
Total Protein: 6.2 g/dL (ref 6.1–8.1)

## 2020-06-28 LAB — STRONGYLOIDES ANTIBODY: Strongyloides IgG Antibody, ELISA: UNDETERMINED

## 2020-06-30 ENCOUNTER — Telehealth: Payer: Self-pay | Admitting: *Deleted

## 2020-06-30 NOTE — Telephone Encounter (Signed)
Patient called for stool sample results.  These are not yet in the chart. Patient upset he has not had a call back yet, RN explained I would call the lab to find out what the delay is.  Spoke with [place] in lab to contact [facility] for more information.   Per [facility], this is held for 5 days, should be back by Wednesday.  RN relayed to patient, he stated he would call Wednesday. Landis Gandy, RN

## 2020-07-01 NOTE — Telephone Encounter (Signed)
I would be shocked they find anything

## 2020-07-02 ENCOUNTER — Telehealth: Payer: Self-pay

## 2020-07-02 NOTE — Telephone Encounter (Signed)
Lab results relayed to the patient. Patient advised the Ova and Parasites labs were not back yet and he will get a call when we get the results. Patient verbalized understanding

## 2020-07-02 NOTE — Telephone Encounter (Signed)
His Creatinine has much  improved from the last labs from 4 to 2 now.

## 2020-07-02 NOTE — Telephone Encounter (Signed)
Patient is calling requesting lab results that were done on 06/23/20. Can you review the labs for the patient?

## 2020-07-03 ENCOUNTER — Telehealth: Payer: Self-pay

## 2020-07-03 LAB — OVA AND PARASITE EXAMINATION
CONCENTRATE RESULT:: NONE SEEN
MICRO NUMBER:: 10982838
SPECIMEN QUALITY:: ADEQUATE
TRICHROME RESULT:: NONE SEEN

## 2020-07-03 NOTE — Telephone Encounter (Signed)
Attempted to call patient regarding lab results. Left voicemail requesting he call back. Victor

## 2020-07-03 NOTE — Telephone Encounter (Signed)
-----   Message from Truman Hayward, MD sent at 07/03/2020  3:38 PM EDT ----- Regarding: FW: No ova or parasites discovered (in case they call again. He can rtc prn and then only as a new referral ----- Message ----- From: Interface, [facility] Lab Results In Sent: 07/03/2020   1:39 PM EDT To: Truman Hayward, MD

## 2020-07-13 ENCOUNTER — Encounter (HOSPITAL_COMMUNITY): Payer: Self-pay | Admitting: Emergency Medicine

## 2020-07-13 DIAGNOSIS — Z5321 Procedure and treatment not carried out due to patient leaving prior to being seen by health care provider: Secondary | ICD-10-CM | POA: Diagnosis not present

## 2020-07-13 DIAGNOSIS — M7989 Other specified soft tissue disorders: Secondary | ICD-10-CM | POA: Insufficient documentation

## 2020-07-13 NOTE — ED Triage Notes (Addendum)
Pt reports L lower leg swelling that started today. He states that it started after he drank some beer with his girl tonight. A&Ox4.

## 2020-07-14 ENCOUNTER — Emergency Department (HOSPITAL_COMMUNITY)
Admission: EM | Admit: 2020-07-14 | Discharge: 2020-07-14 | Disposition: A | Discharge status: Home / Self Care | Payer: Medicare Other | Attending: Emergency Medicine | Admitting: Emergency Medicine

## 2020-07-14 NOTE — ED Notes (Signed)
Pt returned his stickers to registration

## 2021-04-01 ENCOUNTER — Encounter: Payer: Self-pay | Admitting: Gastroenterology

## 2021-11-21 IMAGING — US US BIOPSY
1 series · 9 of 9 positions shown · non-contrast
Comparison: none

INDICATION: 69-year-old male with a history of chronic renal disease,
proteinuria, referred for biopsy

[Series 1: us biopsy (kidney) · 9 of 9 slices shown]
[im 1/9]
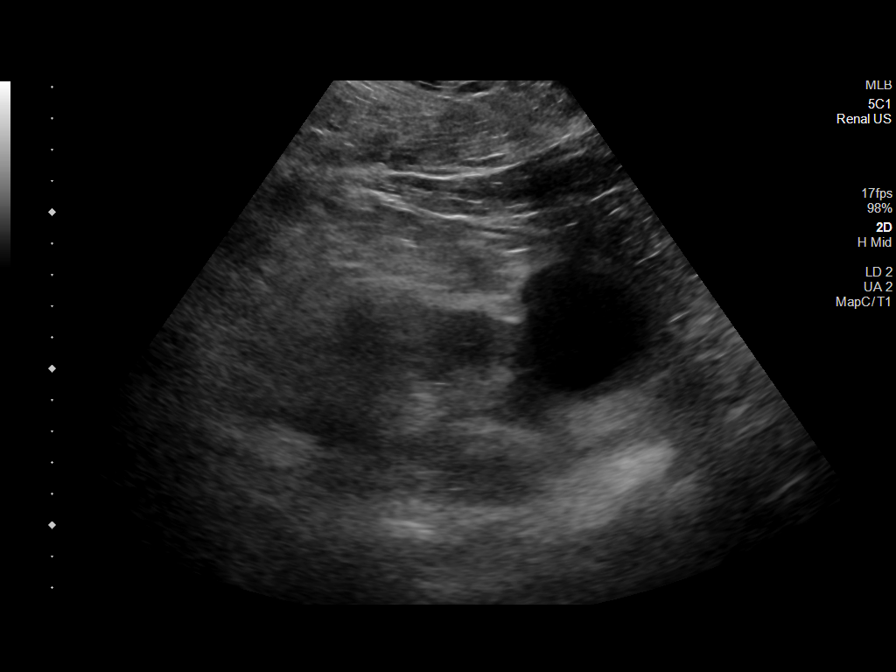
[im 2/9]
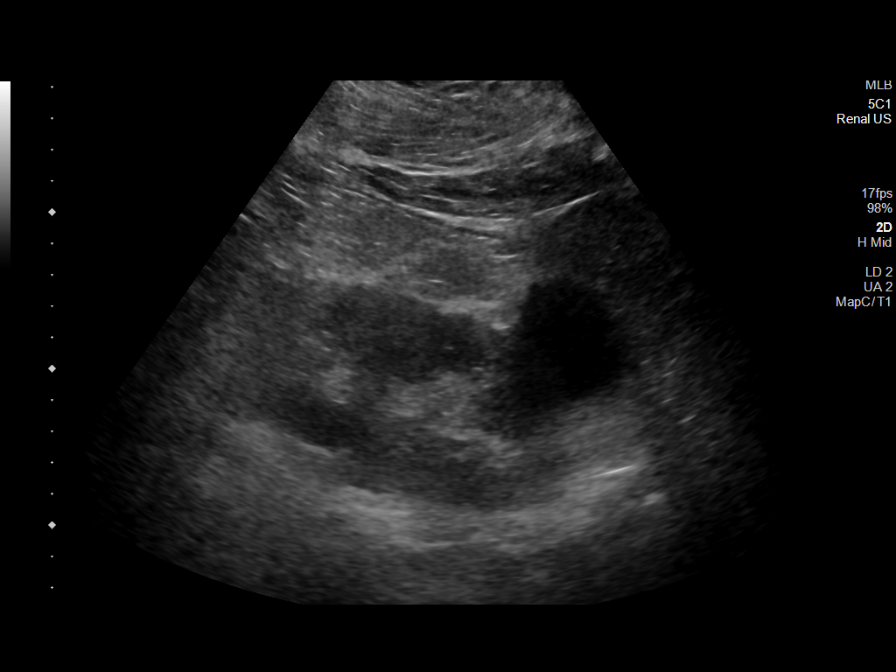
[im 3/9]
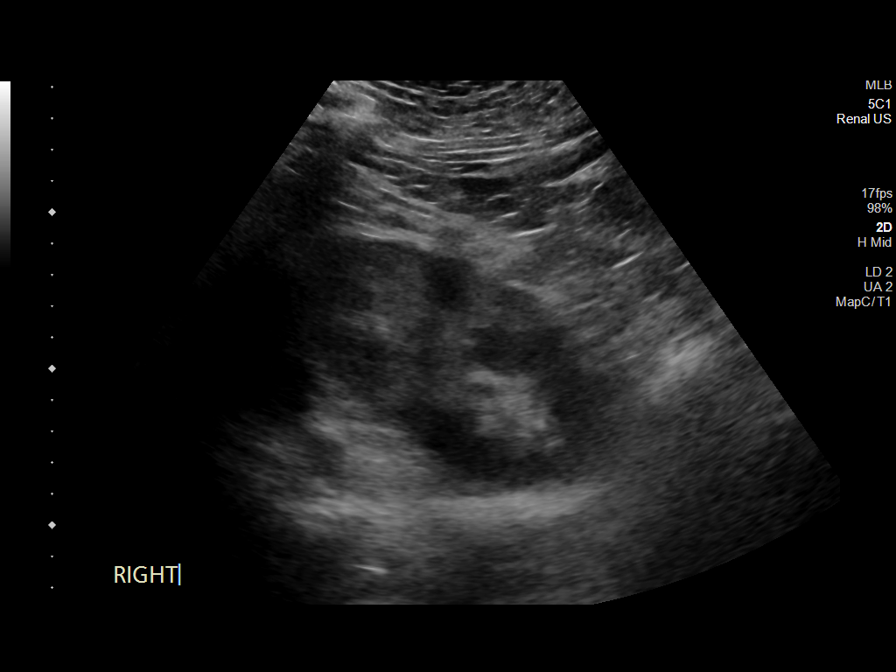
[im 4/9]
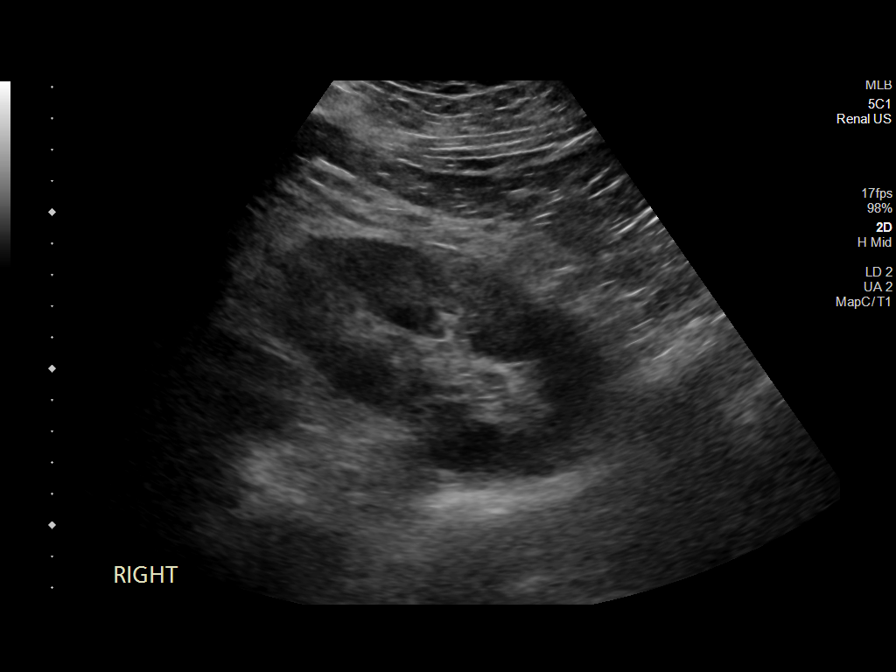
[im 5/9]
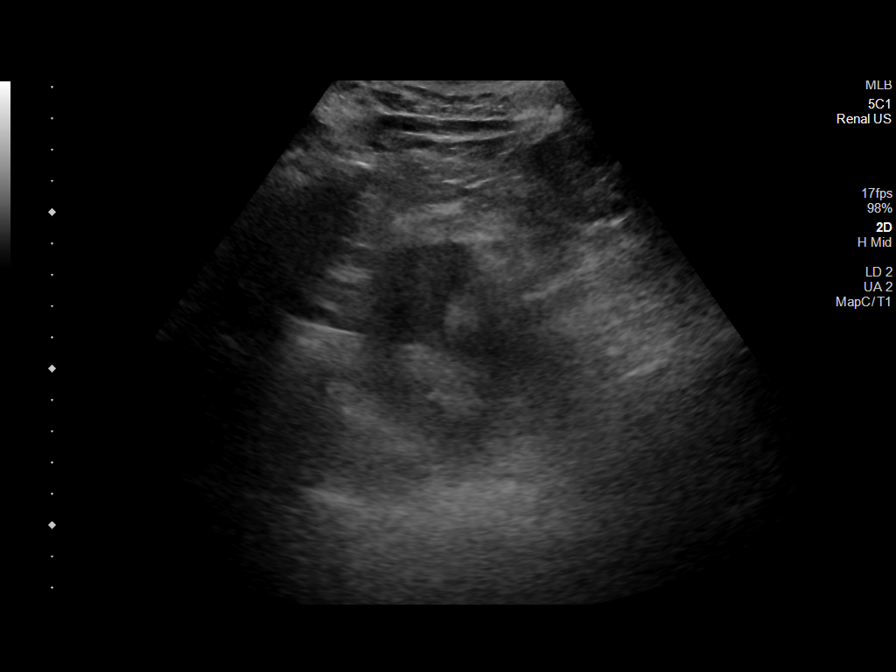
[im 6/9]
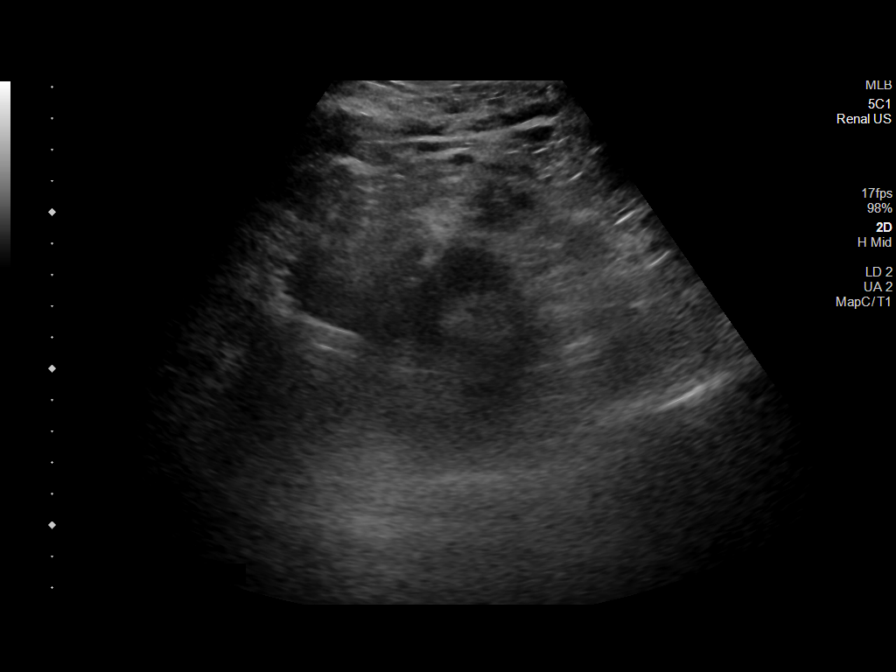
[im 7/9]
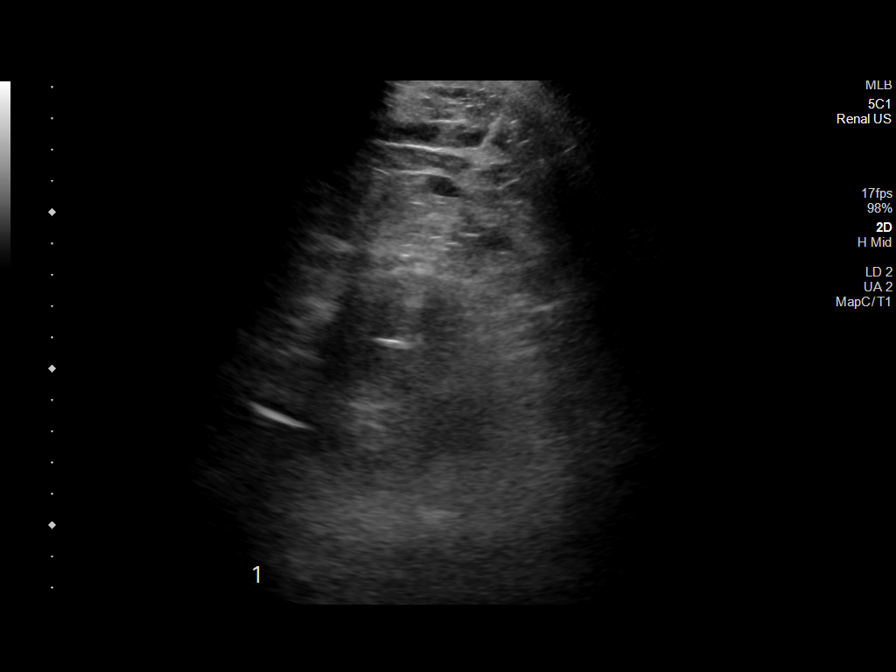
[im 8/9]
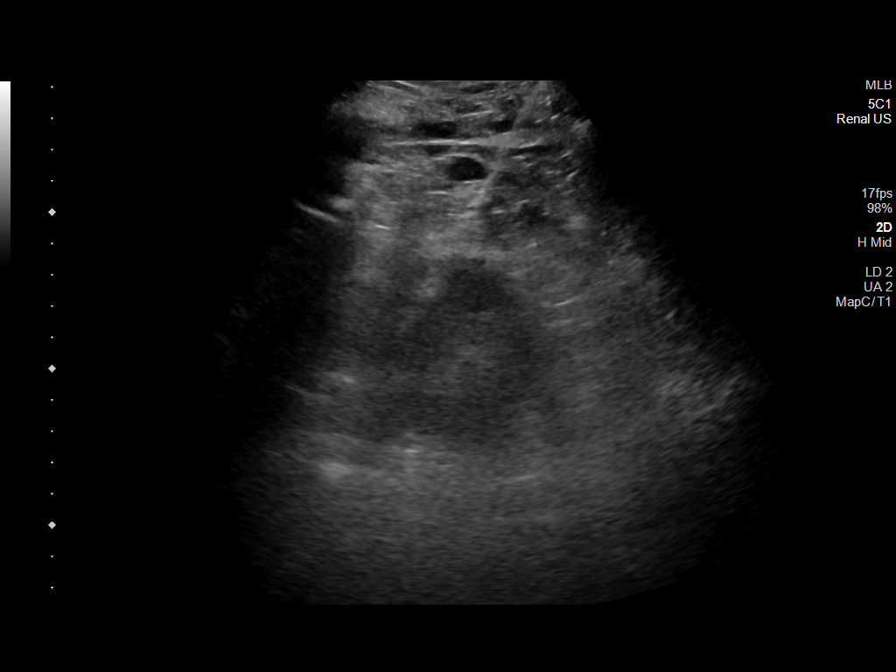
[im 9/9]
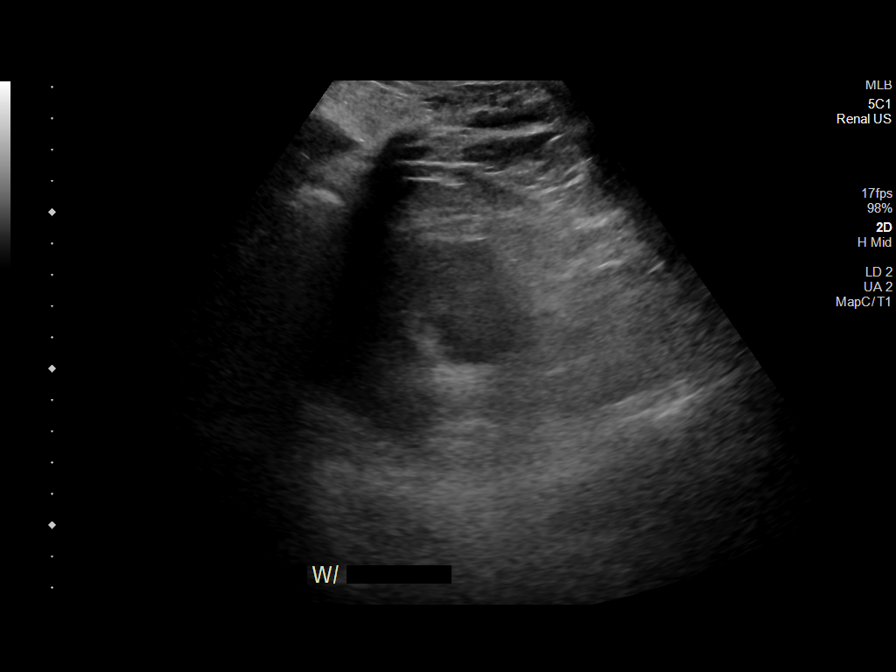

[9 of 9 positions shown; findings below may reference images not displayed]

EXAM:
IMAGE GUIDED BIOPSY LEFT KIDNEY FOR MEDICAL RENAL PURPOSE

MEDICATIONS:
None.

ANESTHESIA/SEDATION:
Moderate (conscious) sedation was employed during this procedure. A
total of Versed 1.0 mg and Fentanyl 50 mcg was administered
intravenously.

Moderate Sedation Time: 11 minutes. The patient's level of
consciousness and vital signs were monitored continuously by
radiology nursing throughout the procedure under my direct
supervision.

FLUOROSCOPY TIME:  None

COMPLICATIONS:
None

PROCEDURE:
Informed written consent was obtained from the patient after a
thorough discussion of the procedural risks, benefits and
alternatives. All questions were addressed. Maximal Sterile Barrier
Technique was utilized including caps, mask, sterile gowns, sterile
gloves, sterile drape, hand hygiene and skin antiseptic. A timeout
was performed prior to the initiation of the procedure.

Patient was positioned prone position on the gantry table. Images
were stored sent to PACs.

Once the patient is prepped and draped in the usual sterile fashion,
the skin and subcutaneous tissues overlying the left kidney were
generously infiltrated 1% lidocaine for local anesthesia.

Using ultrasound guidance, a 15 gauge guide needle was advanced into
the lower lateral cortex of the left kidney.

Once we confirmed location of the needle tip, 2 separate 16 gauge
core biopsy were achieved.

Two Gel-Foam pledgets were infused with a small amount of saline.
The needle was removed.

Final images were stored.

The patient tolerated the procedure well and remained
hemodynamically stable throughout.

No complications were encountered and no significant blood loss
encountered.
IMPRESSION: Status post image guided medical renal biopsy.

## 2022-02-02 ENCOUNTER — Other Ambulatory Visit: Payer: Self-pay | Admitting: Nephrology

## 2022-02-02 ENCOUNTER — Other Ambulatory Visit (HOSPITAL_COMMUNITY): Payer: Self-pay | Admitting: Nephrology

## 2022-02-02 DIAGNOSIS — R809 Proteinuria, unspecified: Secondary | ICD-10-CM

## 2022-02-18 ENCOUNTER — Other Ambulatory Visit: Payer: Self-pay | Admitting: Radiology

## 2022-02-18 DIAGNOSIS — R808 Other proteinuria: Secondary | ICD-10-CM

## 2022-02-19 ENCOUNTER — Encounter (HOSPITAL_COMMUNITY): Payer: Self-pay

## 2022-02-19 ENCOUNTER — Ambulatory Visit (HOSPITAL_COMMUNITY): Admission: RE | Admit: 2022-02-19 | Payer: Medicare (Managed Care) | Source: Ambulatory Visit

## 2022-03-26 ENCOUNTER — Other Ambulatory Visit (HOSPITAL_COMMUNITY): Payer: Self-pay | Admitting: Student

## 2022-03-29 ENCOUNTER — Ambulatory Visit (HOSPITAL_COMMUNITY)
Admission: RE | Admit: 2022-03-29 | Discharge: 2022-03-29 | Disposition: A | Discharge status: Home / Self Care | Payer: Medicare (Managed Care) | Source: Ambulatory Visit | Attending: Nephrology | Admitting: Nephrology

## 2022-04-16 ENCOUNTER — Other Ambulatory Visit: Payer: Self-pay | Admitting: Student

## 2022-04-16 DIAGNOSIS — R808 Other proteinuria: Secondary | ICD-10-CM

## 2022-04-19 ENCOUNTER — Ambulatory Visit (HOSPITAL_COMMUNITY): Admission: RE | Admit: 2022-04-19 | Payer: Medicare (Managed Care) | Source: Ambulatory Visit

## 2022-04-29 NOTE — Progress Notes (Unsigned)
Paul Quint, MD  Donita Brooks D This is not surprising unfortunately. We've also had difficulty reaching him. I'll discuss with my office staff and we will reorder the biopsy or have him give you a call if/when we're able to get in touch with him. Thank you for your help!

## 2022-11-17 ENCOUNTER — Other Ambulatory Visit: Payer: Self-pay

## 2022-11-17 ENCOUNTER — Emergency Department (HOSPITAL_COMMUNITY): Payer: Medicare (Managed Care)

## 2022-11-17 ENCOUNTER — Encounter (HOSPITAL_COMMUNITY): Payer: Self-pay | Admitting: Emergency Medicine

## 2022-11-17 ENCOUNTER — Emergency Department (HOSPITAL_COMMUNITY)
Admission: EM | Admit: 2022-11-17 | Discharge: 2022-11-18 | Disposition: A | Discharge status: Home / Self Care | Payer: Medicare (Managed Care) | Attending: Emergency Medicine | Admitting: Emergency Medicine

## 2022-11-17 DIAGNOSIS — I10 Essential (primary) hypertension: Secondary | ICD-10-CM

## 2022-11-17 DIAGNOSIS — R000 Tachycardia, unspecified: Secondary | ICD-10-CM | POA: Insufficient documentation

## 2022-11-17 DIAGNOSIS — Z7982 Long term (current) use of aspirin: Secondary | ICD-10-CM | POA: Diagnosis not present

## 2022-11-17 DIAGNOSIS — H538 Other visual disturbances: Secondary | ICD-10-CM

## 2022-11-17 DIAGNOSIS — Z79899 Other long term (current) drug therapy: Secondary | ICD-10-CM | POA: Insufficient documentation

## 2022-11-17 DIAGNOSIS — Z1152 Encounter for screening for COVID-19: Secondary | ICD-10-CM | POA: Diagnosis not present

## 2022-11-17 LAB — CBC WITH DIFFERENTIAL/PLATELET
Abs Immature Granulocytes: 0.02 10*3/uL (ref 0.00–0.07)
Basophils Absolute: 0 10*3/uL (ref 0.0–0.1)
Basophils Relative: 0 %
Eosinophils Absolute: 0.3 10*3/uL (ref 0.0–0.5)
Eosinophils Relative: 5 %
HCT: 43.1 % (ref 39.0–52.0)
Hemoglobin: 13.5 g/dL (ref 13.0–17.0)
Immature Granulocytes: 0 %
Lymphocytes Relative: 14 %
Lymphs Abs: 0.9 10*3/uL (ref 0.7–4.0)
MCH: 29.9 pg (ref 26.0–34.0)
MCHC: 31.3 g/dL (ref 30.0–36.0)
MCV: 95.6 fL (ref 80.0–100.0)
Monocytes Absolute: 0.6 10*3/uL (ref 0.1–1.0)
Monocytes Relative: 9 %
Neutro Abs: 4.7 10*3/uL (ref 1.7–7.7)
Neutrophils Relative %: 72 %
Platelets: 206 10*3/uL (ref 150–400)
RBC: 4.51 MIL/uL (ref 4.22–5.81)
RDW: 15.2 % (ref 11.5–15.5)
WBC: 6.5 10*3/uL (ref 4.0–10.5)
nRBC: 0 % (ref 0.0–0.2)

## 2022-11-17 LAB — URINALYSIS, ROUTINE W REFLEX MICROSCOPIC
Bilirubin Urine: NEGATIVE
Glucose, UA: NEGATIVE mg/dL
Ketones, ur: NEGATIVE mg/dL
Nitrite: NEGATIVE
Protein, ur: 100 mg/dL — AB
Specific Gravity, Urine: 1.014 (ref 1.005–1.030)
pH: 5 (ref 5.0–8.0)

## 2022-11-17 LAB — RESP PANEL BY RT-PCR (RSV, FLU A&B, COVID)  RVPGX2
Influenza A by PCR: NEGATIVE
Influenza B by PCR: NEGATIVE
Resp Syncytial Virus by PCR: NEGATIVE
SARS Coronavirus 2 by RT PCR: NEGATIVE

## 2022-11-17 LAB — BASIC METABOLIC PANEL
Anion gap: 7 (ref 5–15)
BUN: 49 mg/dL — ABNORMAL HIGH (ref 8–23)
CO2: 22 mmol/L (ref 22–32)
Calcium: 9.5 mg/dL (ref 8.9–10.3)
Chloride: 109 mmol/L (ref 98–111)
Creatinine, Ser: 4.23 mg/dL — ABNORMAL HIGH (ref 0.61–1.24)
GFR, Estimated: 14 mL/min — ABNORMAL LOW (ref >60)
Glucose, Bld: 91 mg/dL (ref 70–99)
Potassium: 4.1 mmol/L (ref 3.5–5.1)
Sodium: 138 mmol/L (ref 135–145)

## 2022-11-17 LAB — RAPID URINE DRUG SCREEN, HOSP PERFORMED
Amphetamines: NOT DETECTED
Barbiturates: NOT DETECTED
Benzodiazepines: NOT DETECTED
Cocaine: POSITIVE — AB
Opiates: NOT DETECTED
Tetrahydrocannabinol: NOT DETECTED

## 2022-11-17 MED ORDER — TETRACAINE HCL 0.5 % OP SOLN
2.0000 [drp] | Freq: Once | OPHTHALMIC | Status: AC
  Administered 2022-11-17: 2 [drp] via OPHTHALMIC
  Filled 2022-11-17: qty 4

## 2022-11-17 MED ORDER — CARVEDILOL 12.5 MG PO TABS
12.5000 mg | ORAL_TABLET | Freq: Two times a day (BID) | ORAL | Status: DC
  Administered 2022-11-17: 12.5 mg via ORAL

## 2022-11-17 MED ORDER — AMLODIPINE BESYLATE 5 MG PO TABS
10.0000 mg | ORAL_TABLET | Freq: Once | ORAL | Status: AC
  Administered 2022-11-17: 10 mg via ORAL
  Filled 2022-11-17: qty 2

## 2022-11-17 MED ORDER — CARVEDILOL 12.5 MG PO TABS
12.5000 mg | ORAL_TABLET | Freq: Two times a day (BID) | ORAL | Status: DC
  Filled 2022-11-17: qty 1

## 2022-11-17 NOTE — ED Triage Notes (Signed)
Patient arrives ambulatory by POV c/o burning to bilateral eyes and blurred vision. Patients girlfriend states about 3 days ago she noticed his eyes looked blue/grayish and feels like they do not look his usual.

## 2022-11-17 NOTE — ED Provider Triage Note (Signed)
Emergency Medicine Provider Triage Evaluation Note  Paul Donaldson , a 72 y.o. male  was evaluated in triage.  Pt complains of bilateral eye redness, white-yellow discharge, and blurry vision for the last 3 days. Notes prior to this he had a couple weeks of cough and congestions. Wears corrective lenses at baseline. Denies diplopia, loss of vision, focal weakness, injury to eye or other exposure foreign body sensation, fever, chills, nausea, or vomiting. States he has an eye appointment arranged for next week.   Review of Systems  Positive: See HPI Negative: See HPI  Physical Exam  BP (!) 197/121 (BP Location: Left Arm)   Pulse (!) 109   Temp 98.5 F (36.9 C) (Oral)   Resp 18   Ht 6' 3"$  (1.905 m)   Wt 90.7 kg   SpO2 100%   BMI 25.00 kg/m  Gen:   Awake, no distress   Resp:  Normal effort  MSK:   Moves extremities without difficulty  Other:  Bilateral conjunctival injection and watering, EOMI, PERRL, neurologically intact  Medical Decision Making  Medically screening exam initiated at 5:55 PM.  Appropriate orders placed.  Alexis Goodell was informed that the remainder of the evaluation will be completed by another provider, this initial triage assessment does not replace that evaluation, and the importance of remaining in the ED until their evaluation is complete.     Suzzette Righter, PA-C 11/17/22 [id number]

## 2022-11-17 NOTE — ED Provider Notes (Signed)
[place] [facility] AT [facility] Provider Note   CSN: [id number] Arrival date & time: 11/17/22  1656     History  Chief Complaint  Patient presents with   Blurred Vision    Paul Donaldson is a 72 y.o. male.  HPI Patient reports he has had double vision off-and-on for a week.  He reports however with his prescription glasses on he has not experienced any double vision.  No associated headache.  No associated other symptoms.  Patient's companion was concerned for a color change of the eye and some drainage.  She denies any ocular pain.  No photophobia.  He does have follow-up with ophthalmology later this month.  Ports this is a routine scheduled follow-up appointment.  Denies any loss of vision.  Patient reports he is usually compliant with his blood pressure medications but has not taken them today.  He reports he does not check it very frequently at home.  He reports sometimes his blood pressures do run high but he does not get any symptoms of headaches chest pain or shortness of breath.    Home Medications Prior to Admission medications   Medication Sig Start Date End Date Taking? Authorizing Provider  amLODipine (NORVASC) 10 MG tablet Take 10 mg by mouth daily.    [provider]  aspirin 81 MG tablet Take 81 mg by mouth every other day.    [provider]  atorvastatin (LIPITOR) 10 MG tablet Take 10 mg by mouth daily. 01/29/20   [provider]  BREO ELLIPTA 200-25 MCG/INH AEPB Inhale 1 puff into the lungs daily. 01/31/20   [provider]  carvedilol (COREG) 12.5 MG tablet Take 1 tablet (12.5 mg total) by mouth 2 (two) times daily with a meal. 04/29/20 05/29/20  Alma Friendly, MD  furosemide (LASIX) 20 MG tablet Take 20 mg by mouth.    [provider]  ondansetron (ZOFRAN) 4 MG tablet Take 4 mg by mouth every 8 (eight) hours as needed for nausea/vomiting. Patient not taking: Reported on 06/23/2020 04/18/20    [provider]  pantoprazole (PROTONIX) 40 MG tablet Take 1 tablet (40 mg total) by mouth daily. 04/30/20 05/30/20  Alma Friendly, MD  polyvinyl alcohol (LIQUIFILM TEARS) 1.4 % ophthalmic solution Place 1 drop into both eyes as needed for dry eyes. Patient not taking: Reported on 06/23/2020    [provider]      Allergies    Patient has no known allergies.    Review of Systems   Review of Systems  Physical Exam Updated Vital Signs BP (!) 186/99   Pulse 92   Temp 98.5 F (36.9 C) (Oral)   Resp 20   Ht 6' 3"$  (1.905 m)   Wt 90.7 kg   SpO2 100%   BMI 25.00 kg/m  Physical Exam Constitutional:      Comments: Alert nontoxic clinically well in appearance.  No acute distress.  No respiratory distress.  HENT:     Head: Normocephalic and atraumatic.     Mouth/Throat:     Pharynx: Oropharynx is clear.  Eyes:     Extraocular Movements: Extraocular movements intact.     Pupils: Pupils are equal, round, and reactive to light.     Comments: Mild conjunctival injection.  No drainage or discharge.  Intraocular pressures 14 and 15 respectively right and left eye.  Cardiovascular:     Rate and Rhythm: Normal rate and regular rhythm.  Pulmonary:  Effort: Pulmonary effort is normal.     Breath sounds: Normal breath sounds.  Abdominal:     General: There is no distension.     Palpations: Abdomen is soft.     Tenderness: There is no abdominal tenderness. There is no guarding.  Musculoskeletal:        General: No swelling or tenderness. Normal range of motion.     Cervical back: Neck supple.     Right lower leg: No edema.     Left lower leg: No edema.  Skin:    General: Skin is warm and dry.  Neurological:     General: No focal deficit present.     Mental Status: He is oriented to person, place, and time.     Cranial Nerves: No cranial nerve deficit.     Motor: No weakness.     Coordination: Coordination normal.     Comments: Normal finger-nose exam  bilaterally.  Speech is clear.  Normal cognitive function.  No aphasia.  No motor deficits.  EXTR ocular motions normal.  Pupils symmetric and responsive.  Psychiatric:        Mood and Affect: Mood normal.     ED Results / Procedures / Treatments   Labs (all labs ordered are listed, but only abnormal results are displayed) Labs Reviewed  BASIC METABOLIC PANEL - Abnormal; Notable for the following components:      Result Value   BUN 49 (*)    Creatinine, Ser 4.23 (*)    GFR, Estimated 14 (*)    All other components within normal limits  URINALYSIS, ROUTINE W REFLEX MICROSCOPIC - Abnormal; Notable for the following components:   APPearance HAZY (*)    Hgb urine dipstick SMALL (*)    Protein, ur 100 (*)    Leukocytes,Ua SMALL (*)    Bacteria, UA RARE (*)    All other components within normal limits  RAPID URINE DRUG SCREEN, HOSP PERFORMED - Abnormal; Notable for the following components:   Cocaine POSITIVE (*)    All other components within normal limits  RESP PANEL BY RT-PCR (RSV, FLU A&B, COVID)  [id number]  CBC WITH DIFFERENTIAL/PLATELET  TSH    EKG EKG Interpretation  Date/Time:  Wednesday November 17 2022 22:15:12 EST Ventricular Rate:  104 PR Interval:  123 QRS Duration: 78 QT Interval:  330 QTC Calculation: 434 R Axis:   61 Text Interpretation: Sinus tachycardia Anterior infarct, old Repol abnrm, no STEMI no old comparison Confirmed by Charlesetta Shanks [phone]) on 11/17/2022 11:40:38 PM  Radiology CT Head Wo Contrast  Result Date: 11/17/2022 CLINICAL DATA:  Visual disturbance x3 days. EXAM: CT HEAD WITHOUT CONTRAST TECHNIQUE: Contiguous axial images were obtained from the base of the skull through the vertex without intravenous contrast. RADIATION DOSE REDUCTION: This exam was performed according to the departmental dose-optimization program which includes automated exposure control, adjustment of the mA and/or kV according to patient size and/or use of iterative  reconstruction technique. COMPARISON:  None Available. FINDINGS: Brain: There is mild cerebral atrophy with widening of the extra-axial spaces and ventricular dilatation. There are areas of decreased attenuation within the white matter tracts of the supratentorial brain, consistent with microvascular disease changes. Vascular: No hyperdense vessel or unexpected calcification. Skull: Normal. Negative for fracture or focal lesion. Sinuses/Orbits: No acute finding. 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 IMPRESSION: 1. No acute intracranial abnormality. 2. Generalized cerebral atrophy with widening  of the extra-axial spaces and ventricular dilatation. 3. 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 Electronically Signed   By: Virgina Norfolk M.D.   On: 11/17/2022 23:06    Procedures Procedures    Medications Ordered in ED Medications  carvedilol (COREG) tablet 12.5 mg (12.5 mg Oral Given 11/17/22 2238)  tetracaine (PONTOCAINE) 0.5 % ophthalmic solution 2 drop (2 drops Both Eyes Given 11/17/22 2235)  amLODipine (NORVASC) tablet 10 mg (10 mg Oral Given 11/17/22 2234)    ED Course/ Medical Decision Making/ A&P                             Medical Decision Making Amount and/or Complexity of Data Reviewed Labs: ordered. Radiology: ordered.  Risk Prescription drug management.   Patient presents with complaint of blurred vision or possibly double vision over the past 3+ days.  He however clarifies that this does correct with his glasses.  No other associated symptoms.  No headache, no vertigo, no visual loss.  This time neurologic exam is normal.  Patient is very hypertensive.  He reports mostly compliance with his blood pressure medication but does not check it very regularly.  At this time although pressure singly elevated I have lower suspicion for hypertensive emergency.  Patient does not have headache or  any other symptoms in association with this.  He is clinically well in appearance.  Due to hypertension and age we will proceed with CT head.  CT head negative for any acute findings.  EKG has LVH but does not have STEMI pattern present.  Patient is not having any chest pain or shortness of breath.  Patient has had chronic renal insufficiency.  Today he has BUN/creatinine of 49\4.23, it appears as September 2023 he had BUN and creatinine of 58/3.5 renal function is declining.  I suspect this is in association with chronic poorly managed hypertension.  Patient reports he has follow-up already established with nephrology on the 22nd of this month.  He reports they are going to be doing a renal biopsy and getting him evaluated for a AV fistula for dialysis.  Patient was significantly hypertensive but is not experiencing any chest pain.  He reports he did use some cocaine on Super Bowl Sunday and that might have contributed.  He tested positive for cocaine but not other drugs.  He reports it is a very rare thing for him to use that.  He does not smoke cigarettes.  He is not experiencing any headache or chest pain.  Patient was given home medications and blood pressure has come down to 180s over 90s.  At this time there does not appear to be acute endorgan damage from hypertension but patient clearly has significant chronic complications.  At this time stable for discharge.  He has appropriate follow-up scheduled for this month.  He reports he will be managing blood pressure at home with monitoring and compliance with medications.        Final Clinical Impression(s) / ED Diagnoses Final diagnoses:  Blurred vision  Hypertension, unspecified type    Rx / DC Orders ED Discharge Orders     None         Charlesetta Shanks, MD 11/17/22 2357

## 2022-11-18 LAB — TSH: TSH: 1.247 u[IU]/mL (ref 0.350–4.500)

## 2022-11-18 NOTE — ED Notes (Signed)
Patient left did not want to wait for paperwork

## 2023-02-08 ENCOUNTER — Other Ambulatory Visit: Payer: Self-pay

## 2023-02-08 ENCOUNTER — Ambulatory Visit (HOSPITAL_COMMUNITY)
Admission: EM | Admit: 2023-02-08 | Discharge: 2023-02-09 | Payer: Medicare (Managed Care) | Attending: Psychiatry | Admitting: Psychiatry

## 2023-02-08 DIAGNOSIS — Z5900 Homelessness unspecified: Secondary | ICD-10-CM | POA: Diagnosis not present

## 2023-02-08 DIAGNOSIS — R450 Nervousness: Secondary | ICD-10-CM | POA: Diagnosis not present

## 2023-02-08 DIAGNOSIS — R45851 Suicidal ideations: Secondary | ICD-10-CM | POA: Insufficient documentation

## 2023-02-08 DIAGNOSIS — Z79899 Other long term (current) drug therapy: Secondary | ICD-10-CM | POA: Insufficient documentation

## 2023-02-08 DIAGNOSIS — F32A Depression, unspecified: Secondary | ICD-10-CM | POA: Diagnosis not present

## 2023-02-08 DIAGNOSIS — T461X6A Underdosing of calcium-channel blockers, initial encounter: Secondary | ICD-10-CM | POA: Diagnosis not present

## 2023-02-08 DIAGNOSIS — F1410 Cocaine abuse, uncomplicated: Secondary | ICD-10-CM | POA: Insufficient documentation

## 2023-02-08 DIAGNOSIS — R45850 Homicidal ideations: Secondary | ICD-10-CM | POA: Insufficient documentation

## 2023-02-08 DIAGNOSIS — F1210 Cannabis abuse, uncomplicated: Secondary | ICD-10-CM | POA: Insufficient documentation

## 2023-02-08 DIAGNOSIS — F99 Mental disorder, not otherwise specified: Secondary | ICD-10-CM | POA: Insufficient documentation

## 2023-02-08 LAB — COMPREHENSIVE METABOLIC PANEL
ALT: 15 U/L (ref 0–44)
AST: 16 U/L (ref 15–41)
Albumin: 3.1 g/dL — ABNORMAL LOW (ref 3.5–5.0)
Alkaline Phosphatase: 72 U/L (ref 38–126)
Anion gap: 13 (ref 5–15)
BUN: 55 mg/dL — ABNORMAL HIGH (ref 8–23)
CO2: 18 mmol/L — ABNORMAL LOW (ref 22–32)
Calcium: 9.3 mg/dL (ref 8.9–10.3)
Chloride: 108 mmol/L (ref 98–111)
Creatinine, Ser: 3.11 mg/dL — ABNORMAL HIGH (ref 0.61–1.24)
GFR, Estimated: 20 mL/min — ABNORMAL LOW (ref >60)
Glucose, Bld: 74 mg/dL (ref 70–99)
Potassium: 5.4 mmol/L — ABNORMAL HIGH (ref 3.5–5.1)
Sodium: 139 mmol/L (ref 135–145)
Total Bilirubin: 0.4 mg/dL (ref 0.3–1.2)
Total Protein: 6.1 g/dL — ABNORMAL LOW (ref 6.5–8.1)

## 2023-02-08 LAB — CBC WITH DIFFERENTIAL/PLATELET
Abs Immature Granulocytes: 0.01 10*3/uL (ref 0.00–0.07)
Basophils Absolute: 0 10*3/uL (ref 0.0–0.1)
Basophils Relative: 1 %
Eosinophils Absolute: 0.4 10*3/uL (ref 0.0–0.5)
Eosinophils Relative: 7 %
HCT: 46.6 % (ref 39.0–52.0)
Hemoglobin: 15 g/dL (ref 13.0–17.0)
Immature Granulocytes: 0 %
Lymphocytes Relative: 26 %
Lymphs Abs: 1.4 10*3/uL (ref 0.7–4.0)
MCH: 30.2 pg (ref 26.0–34.0)
MCHC: 32.2 g/dL (ref 30.0–36.0)
MCV: 94 fL (ref 80.0–100.0)
Monocytes Absolute: 0.4 10*3/uL (ref 0.1–1.0)
Monocytes Relative: 7 %
Neutro Abs: 3.1 10*3/uL (ref 1.7–7.7)
Neutrophils Relative %: 59 %
Platelets: 206 10*3/uL (ref 150–400)
RBC: 4.96 MIL/uL (ref 4.22–5.81)
RDW: 15.8 % — ABNORMAL HIGH (ref 11.5–15.5)
WBC: 5.3 10*3/uL (ref 4.0–10.5)
nRBC: 0 % (ref 0.0–0.2)

## 2023-02-08 LAB — HEMOGLOBIN A1C
Hgb A1c MFr Bld: 5.1 % (ref 4.8–5.6)
Mean Plasma Glucose: 99.67 mg/dL

## 2023-02-08 LAB — POCT URINE DRUG SCREEN - MANUAL ENTRY (I-SCREEN)
POC Amphetamine UR: NOT DETECTED
POC Buprenorphine (BUP): NOT DETECTED
POC Cocaine UR: POSITIVE — AB
POC Marijuana UR: NOT DETECTED
POC Methadone UR: NOT DETECTED
POC Methamphetamine UR: NOT DETECTED
POC Morphine: NOT DETECTED
POC Oxazepam (BZO): NOT DETECTED
POC Oxycodone UR: NOT DETECTED
POC Secobarbital (BAR): NOT DETECTED

## 2023-02-08 MED ORDER — CARVEDILOL 12.5 MG PO TABS
12.5000 mg | ORAL_TABLET | Freq: Two times a day (BID) | ORAL | Status: DC
  Administered 2023-02-08 – 2023-02-09 (×3): 12.5 mg via ORAL
  Filled 2023-02-08 (×3): qty 1

## 2023-02-08 MED ORDER — ONDANSETRON HCL 4 MG PO TABS
4.0000 mg | ORAL_TABLET | Freq: Three times a day (TID) | ORAL | Status: DC | PRN
  Administered 2023-02-09: 4 mg via ORAL
  Filled 2023-02-08: qty 1

## 2023-02-08 MED ORDER — MAGNESIUM HYDROXIDE 400 MG/5ML PO SUSP: 30.0000 mL | Freq: Every day | ORAL | Status: DC | PRN

## 2023-02-08 MED ORDER — ALUM & MAG HYDROXIDE-SIMETH 200-200-20 MG/5ML PO SUSP
30.0000 mL | ORAL | Status: DC | PRN
  Administered 2023-02-09: 30 mL via ORAL
  Filled 2023-02-08: qty 30

## 2023-02-08 MED ORDER — OLANZAPINE 5 MG PO TBDP: 5.0000 mg | ORAL_TABLET | Freq: Three times a day (TID) | ORAL | Status: DC | PRN

## 2023-02-08 MED ORDER — LORAZEPAM 1 MG PO TABS: 1.0000 mg | ORAL_TABLET | ORAL | Status: DC | PRN

## 2023-02-08 MED ORDER — ATORVASTATIN CALCIUM 10 MG PO TABS
10.0000 mg | ORAL_TABLET | Freq: Every day | ORAL | Status: DC
  Administered 2023-02-08 – 2023-02-09 (×2): 10 mg via ORAL
  Filled 2023-02-08 (×2): qty 1

## 2023-02-08 MED ORDER — AMLODIPINE BESYLATE 10 MG PO TABS
10.0000 mg | ORAL_TABLET | Freq: Every day | ORAL | Status: DC
  Administered 2023-02-08 – 2023-02-09 (×2): 10 mg via ORAL
  Filled 2023-02-08 (×2): qty 1

## 2023-02-08 MED ORDER — ACETAMINOPHEN 325 MG PO TABS: 650.0000 mg | ORAL_TABLET | Freq: Four times a day (QID) | ORAL | Status: DC | PRN

## 2023-02-08 MED ORDER — FLUTICASONE FUROATE-VILANTEROL 200-25 MCG/ACT IN AEPB
1.0000 | INHALATION_SPRAY | Freq: Every day | RESPIRATORY_TRACT | Status: DC
  Administered 2023-02-08 – 2023-02-09 (×2): 1 via RESPIRATORY_TRACT
  Filled 2023-02-08: qty 28

## 2023-02-08 MED ORDER — FUROSEMIDE 40 MG PO TABS
40.0000 mg | ORAL_TABLET | Freq: Once | ORAL | Status: AC
  Administered 2023-02-08: 40 mg via ORAL
  Filled 2023-02-08: qty 1

## 2023-02-08 MED ORDER — ZIPRASIDONE MESYLATE 20 MG IM SOLR: 20.0000 mg | INTRAMUSCULAR | Status: DC | PRN

## 2023-02-08 NOTE — ED Notes (Signed)
Pt laying bed eating a sandwich and drink water and juice. Pt calm and cooperative. No c/o pain or distress. Will continue to monitor for safety

## 2023-02-08 NOTE — ED Provider Notes (Signed)
[facility] Continuous Assessment Admission H&P  Date: 02/08/23 Patient Name: Paul Donaldson MRN: [id number] Chief Complaint: suicidal thoughts   Diagnoses:  Final diagnoses:  Cocaine abuse (HCC)  Homelessness unspecified  Suicidal ideation  Marijuana abuse    HPI: Paul Donaldson,  72  y/o male.  With a history of cocaine abuse and marijuana abuse present to [facility] voluntarily. Per the patient is he has SI with plan to kill himself by borrowing a gun from someone. According to patient everyone on the streets has a gun.  Patient reports he has been using cocaine and smoking marijuana or alcohol for years now according to the patient he relapsed 2 years ago and he no cocaine and marijuana daily according to the patient he last used 3 to 4 g of cocaine today.  According to the patient he is from the [facility] area however according to him and he sold his house for $50,000 and he has used it to buy crack cocaine for the past 1 month.  He is not sure if the patient is telling or he is just giving this information.  According to the patient he try rehab 26 or 27 years ago patient reports his family does not deal with him. 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 It is hard to say the patient is forthcoming with information, he is just malingering looking for someone to stay.  Because he did state that he was home and depressed.  Physical examination occasion, patient is alert and oriented x 4, speech is clear, maintaining eye contact.  Patient appears to be depressed at the black.  Patient endorsed suicidal ideations with plans to borrow a gun from someone she used to kill himself.  Patient also endorsed homicidal ideation with if he wants to kill anyone he stated that he feels depressed.  Patient reports he also uses cocaine daily with using 3 to 4 g/day and he also uses marijuana daily patient denies  that he has a personal access to gun.  Patient denies he is currently seeing a psychiatrist or therapist.  According to the patient he takes amlodipine but he left it at someone's house and he has not taken it in over a week.  According to the patient he has seen a primary care at [facility].  Recommend inpatient observation  Total Time spent with patient: 30 minutes  Musculoskeletal  Strength & Muscle Tone: within normal limits Gait & Station: normal Patient leans: N/A  Psychiatric Specialty Exam  Presentation General Appearance:  Casual  Eye Contact: Good  Speech: Clear and Coherent  Speech Volume: Normal  Handedness: Right   Mood and Affect  Mood: Anxious; Depressed  Affect: Depressed   Thought Process  Thought Processes: Coherent  Descriptions of Associations:Circumstantial  Orientation:Full (Time, Place and Person)  Thought Content:WDL    Hallucinations:Hallucinations: None  Ideas of Reference:None  Suicidal Thoughts:Suicidal Thoughts: Yes, Passive SI Passive Intent and/or Plan: With Intent; With Plan  Homicidal Thoughts:Homicidal Thoughts: No   Sensorium  Memory: Immediate Fair  Judgment: Poor  Insight: Poor   Executive Functions  Concentration: Fair  Attention Span: Fair  Recall: Good  Fund of Knowledge: Good  Language: Good   Psychomotor Activity  Psychomotor Activity: Psychomotor Activity: Normal   Assets  Assets: Desire for Improvement; Housing   Sleep  Sleep: Sleep: Fair Number of Hours of Sleep: 6  Nutritional Assessment (For OBS and FBC admissions only) Has the patient had a weight loss or gain of 10 pounds or more in the last 3 months?: No Has the patient had a decrease in food intake/or appetite?: No Does the patient have dental problems?: No Does the patient have eating habits or behaviors that may be indicators of an eating disorder including binging or inducing vomiting?: No Has the patient  recently lost weight without trying?: 0 Has the patient been eating poorly because of a decreased appetite?: 0 Malnutrition Screening Tool Score: 0    Physical Exam HENT:     Head: Normocephalic.     Nose: Nose normal.  Cardiovascular:     Rate and Rhythm: Normal rate.  Pulmonary:     Effort: Pulmonary effort is normal.  Musculoskeletal:        General: Normal range of motion.     Cervical back: Normal range of motion.  Neurological:     General: No focal deficit present.     Mental Status: He is alert.  Psychiatric:        Mood and Affect: Mood normal.        Behavior: Behavior normal.        Thought Content: Thought content normal.        Judgment: Judgment normal.    Review of Systems  Constitutional: Negative.   HENT: Negative.    Eyes: Negative.   Respiratory: Negative.    Cardiovascular: Negative.   Gastrointestinal: Negative.   Genitourinary: Negative.   Musculoskeletal: Negative.   Skin: Negative.   Neurological: Negative.   Psychiatric/Behavioral:  Positive for depression, substance abuse and suicidal ideas. The patient is nervous/anxious.     Blood pressure (!) 162/100, pulse (!) 102, temperature 98.1 F (36.7 C), temperature source Oral, resp. rate 20, SpO2 100 %. There is no height or weight on file to calculate BMI.  Past Psychiatric History: Cocaine and marijuana abuse  Is the patient at risk to self? Yes  Has the patient been a risk to self in the past 6 months? Yes .    Has the patient been a risk to self within the distant past? Yes   Is the patient a risk to others? Yes   Has the patient been a risk to others in the past 6 months? Yes   Has the patient been a risk to others within the distant past? Yes   Past Medical History: See chart  Family History: Unknown  Social History: Cocaine and marijuana  Last Labs:  Admission on 02/08/2023  Component Date Value Ref Range Status   POC Amphetamine UR 02/08/2023 None Detected  NONE DETECTED (Cut  Off Level 1000 ng/mL) Preliminary   POC Secobarbital (BAR) 02/08/2023 None Detected  NONE DETECTED (Cut Off Level 300 ng/mL) Preliminary   POC Buprenorphine (BUP) 02/08/2023 None Detected  NONE DETECTED (Cut Off Level 10 ng/mL) Preliminary   POC Oxazepam (BZO) 02/08/2023 None Detected  NONE DETECTED (Cut Off Level 300 ng/mL) Preliminary   POC Cocaine UR 02/08/2023 Positive (A)  NONE DETECTED (Cut Off Level 300 ng/mL) Preliminary   POC Methamphetamine UR 02/08/2023 None Detected  NONE DETECTED (Cut Off Level 1000 ng/mL) Preliminary   POC Morphine 02/08/2023 None Detected  NONE DETECTED (Cut Off Level 300 ng/mL) Preliminary   POC Methadone UR 02/08/2023 None Detected  NONE DETECTED (Cut Off Level 300 ng/mL) Preliminary   POC Oxycodone UR 02/08/2023 None Detected  NONE DETECTED (Cut Off Level 100 ng/mL) Preliminary  POC Marijuana UR 02/08/2023 None Detected  NONE DETECTED (Cut Off Level 50 ng/mL) Preliminary  Admission on 11/17/2022, Discharged on 11/18/2022  Component Date Value Ref Range Status   SARS Coronavirus 2 by RT PCR 11/17/2022 NEGATIVE  NEGATIVE Final   Comment: (NOTE) SARS-CoV-2 target nucleic acids are NOT DETECTED.  The SARS-CoV-2 RNA is generally detectable in upper respiratory specimens during the acute phase of infection. The lowest concentration of SARS-CoV-2 viral copies this assay can detect is 138 copies/mL. A negative result does not preclude SARS-Cov-2 infection and should not be used as the sole basis for treatment or other patient management decisions. A negative result may occur with  improper specimen collection/handling, submission of specimen other than nasopharyngeal swab, presence of viral mutation(s) within the areas targeted by this assay, and inadequate number of viral copies(<138 copies/mL). A negative result must be combined with clinical observations, patient history, and epidemiological information. The expected result is Negative.  Fact Sheet for  Patients:  BloggerCourse.com  Fact Sheet for Healthcare Providers:  SeriousBroker.it  This test is no                          t yet approved or cleared by the [place] FDA and  has been authorized for detection and/or diagnosis of SARS-CoV-2 by FDA under an Emergency Use Authorization (EUA). This EUA will remain  in effect (meaning this test can be used) for the duration of the COVID-19 declaration under Section 564(b)(1) of the Act, 21 U.S.C.section 360bbb-3(b)(1), unless the authorization is terminated  or revoked sooner.       Influenza A by PCR 11/17/2022 NEGATIVE  NEGATIVE Final   Influenza B by PCR 11/17/2022 NEGATIVE  NEGATIVE Final   Comment: (NOTE) The Xpert Xpress SARS-CoV-2/FLU/RSV plus assay is intended as an aid in the diagnosis of influenza from Nasopharyngeal swab specimens and should not be used as a sole basis for treatment. Nasal washings and aspirates are unacceptable for Xpert Xpress SARS-CoV-2/FLU/RSV testing.  Fact Sheet for Patients: BloggerCourse.com  Fact Sheet for Healthcare Providers: SeriousBroker.it  This test is not yet approved or cleared by the [place] FDA and has been authorized for detection and/or diagnosis of SARS-CoV-2 by FDA under an Emergency Use Authorization (EUA). This EUA will remain in effect (meaning this test can be used) for the duration of the COVID-19 declaration under Section 564(b)(1) of the Act, 21 U.S.C. section 360bbb-3(b)(1), unless the authorization is terminated or revoked.     Resp Syncytial Virus by PCR 11/17/2022 NEGATIVE  NEGATIVE Final   Comment: (NOTE) Fact Sheet for Patients: BloggerCourse.com  Fact Sheet for Healthcare Providers: SeriousBroker.it  This test is not yet approved or cleared by the [place] FDA and has been authorized for  detection and/or diagnosis of SARS-CoV-2 by FDA under an Emergency Use Authorization (EUA). This EUA will remain in effect (meaning this test can be used) for the duration of the COVID-19 declaration under Section 564(b)(1) of the Act, 21 U.S.C. section 360bbb-3(b)(1), unless the authorization is terminated or revoked.  Performed at [facility], [address], [place], [place] [address]    Sodium 11/17/2022 138  135 - 145 mmol/L Final   Potassium 11/17/2022 4.1  3.5 - 5.1 mmol/L Final   Chloride 11/17/2022 109  98 - 111 mmol/L Final   CO2 11/17/2022 22  22 - 32 mmol/L Final   Glucose, Bld 11/17/2022 91  70 - 99 mg/dL Final  Glucose reference range applies only to samples taken after fasting for at least 8 hours.   BUN 11/17/2022 49 (H)  8 - 23 mg/dL Final   Creatinine, Ser 11/17/2022 4.23 (H)  0.61 - 1.24 mg/dL Final   Calcium 16/07/9603 9.5  8.9 - 10.3 mg/dL Final   GFR, Estimated 11/17/2022 14 (L)  >60 mL/min Final   Comment: (NOTE) Calculated using the CKD-EPI Creatinine Equation (2021)    Anion gap 11/17/2022 7  5 - 15 Final   Performed at [facility], [address] [place], [place] [address]   WBC 11/17/2022 6.5  4.0 - 10.5 K/uL Final   RBC 11/17/2022 4.51  4.22 - 5.81 MIL/uL Final   Hemoglobin 11/17/2022 13.5  13.0 - 17.0 g/dL Final   HCT 11/91/4782 43.1  39.0 - 52.0 % Final   MCV 11/17/2022 95.6  80.0 - 100.0 fL Final   MCH 11/17/2022 29.9  26.0 - 34.0 pg Final   MCHC 11/17/2022 31.3  30.0 - 36.0 g/dL Final   RDW 95/62/1308 15.2  11.5 - 15.5 % Final   Platelets 11/17/2022 206  150 - 400 K/uL Final   nRBC 11/17/2022 0.0  0.0 - 0.2 % Final   Neutrophils Relative % 11/17/2022 72  % Final   Neutro Abs 11/17/2022 4.7  1.7 - 7.7 K/uL Final   Lymphocytes Relative 11/17/2022 14  % Final   Lymphs Abs 11/17/2022 0.9  0.7 - 4.0 K/uL Final   Monocytes Relative 11/17/2022 9  % Final   Monocytes Absolute 11/17/2022 0.6  0.1 - 1.0 K/uL Final    Eosinophils Relative 11/17/2022 5  % Final   Eosinophils Absolute 11/17/2022 0.3  0.0 - 0.5 K/uL Final   Basophils Relative 11/17/2022 0  % Final   Basophils Absolute 11/17/2022 0.0  0.0 - 0.1 K/uL Final   Immature Granulocytes 11/17/2022 0  % Final   Abs Immature Granulocytes 11/17/2022 0.02  0.00 - 0.07 K/uL Final   Performed at [facility], [address] [place], [place] [address]   Color, Urine 11/17/2022 YELLOW  YELLOW Final   APPearance 11/17/2022 HAZY (A)  CLEAR Final   Specific Gravity, Urine 11/17/2022 1.014  1.005 - 1.030 Final   pH 11/17/2022 5.0  5.0 - 8.0 Final   Glucose, UA 11/17/2022 NEGATIVE  NEGATIVE mg/dL Final   Hgb urine dipstick 11/17/2022 SMALL (A)  NEGATIVE Final   Bilirubin Urine 11/17/2022 NEGATIVE  NEGATIVE Final   Ketones, ur 11/17/2022 NEGATIVE  NEGATIVE mg/dL Final   Protein, ur 69/62/9528 100 (A)  NEGATIVE mg/dL Final   Nitrite 41/32/4401 NEGATIVE  NEGATIVE Final   Leukocytes,Ua 11/17/2022 SMALL (A)  NEGATIVE Final   RBC / HPF 11/17/2022 0-5  0 - 5 RBC/hpf Final   WBC, UA 11/17/2022 6-10  0 - 5 WBC/hpf Final   Bacteria, UA 11/17/2022 RARE (A)  NONE SEEN Final   Squamous Epithelial / HPF 11/17/2022 0-5  0 - 5 /HPF Final   Mucus 11/17/2022 PRESENT   Final   Hyaline Casts, UA 11/17/2022 PRESENT   Final   Performed at [facility], [address] [place], [place] [address]   Opiates 11/17/2022 NONE DETECTED  NONE DETECTED Final   Cocaine 11/17/2022 POSITIVE (A)  NONE DETECTED Final   Benzodiazepines 11/17/2022 NONE DETECTED  NONE DETECTED Final   Amphetamines 11/17/2022 NONE DETECTED  NONE DETECTED Final   Tetrahydrocannabinol 11/17/2022 NONE DETECTED  NONE DETECTED Final   Barbiturates 11/17/2022 NONE  DETECTED  NONE DETECTED Final   Comment: (NOTE) DRUG SCREEN FOR MEDICAL PURPOSES ONLY.  IF CONFIRMATION IS NEEDED FOR ANY PURPOSE, NOTIFY LAB WITHIN 5 DAYS.  LOWEST DETECTABLE LIMITS FOR URINE DRUG SCREEN Drug  Class                     Cutoff (ng/mL) Amphetamine and metabolites    1000 Barbiturate and metabolites    200 Benzodiazepine                 200 Opiates and metabolites        300 Cocaine and metabolites        300 THC                            50 Performed at [facility], [address] [place] [address] [place], [place] [address]    TSH 11/17/2022 1.247  0.350 - 4.500 uIU/mL Final   Comment: Performed by a 3rd Generation assay with a functional sensitivity of <=0.01 uIU/mL. Performed at [facility], [address], [place], [place] [address]     Allergies: Patient has no known allergies.  Medications:  Facility Ordered Medications  Medication   acetaminophen (TYLENOL) tablet 650 mg   alum & mag hydroxide-simeth (MAALOX/MYLANTA) 200-200-20 MG/5ML suspension 30 mL   OLANZapine zydis (ZYPREXA) disintegrating tablet 5 mg   And   LORazepam (ATIVAN) tablet 1 mg   And   ziprasidone (GEODON) injection 20 mg   amLODipine (NORVASC) tablet 10 mg   atorvastatin (LIPITOR) tablet 10 mg   fluticasone furoate-vilanterol (BREO ELLIPTA) 200-25 MCG/ACT 1 puff   carvedilol (COREG) tablet 12.5 mg   ondansetron (ZOFRAN) tablet 4 mg   PTA Medications  Medication Sig   amLODipine (NORVASC) 10 MG tablet Take 10 mg by mouth daily.   aspirin 81 MG tablet Take 81 mg by mouth every other day.   atorvastatin (LIPITOR) 10 MG tablet Take 10 mg by mouth daily.   BREO ELLIPTA 200-25 MCG/INH AEPB Inhale 1 puff into the lungs daily.   ondansetron (ZOFRAN) 4 MG tablet Take 4 mg by mouth every 8 (eight) hours as needed for nausea/vomiting. (Patient not taking: Reported on 06/23/2020)   polyvinyl alcohol (LIQUIFILM TEARS) 1.4 % ophthalmic solution Place 1 drop into both eyes as needed for dry eyes. (Patient not taking: Reported on 06/23/2020)   carvedilol (COREG) 12.5 MG tablet Take 1 tablet (12.5 mg total) by mouth 2 (two) times daily with a meal.   pantoprazole (PROTONIX) 40 MG tablet  Take 1 tablet (40 mg total) by mouth daily.   furosemide (LASIX) 20 MG tablet Take 20 mg by mouth.      Medical Decision Making  The patient observation  Lab Orders         CBC with Differential/Platelet         Comprehensive metabolic panel         Hemoglobin A1c         Lipid panel         Ethanol         TSH         POCT Urine Drug Screen - (I-Screen)      Meds ordered this encounter  Medications   acetaminophen (TYLENOL) tablet 650 mg   alum & mag hydroxide-simeth (MAALOX/MYLANTA) 200-200-20 MG/5ML suspension 30 mL   DISCONTD: magnesium hydroxide (MILK OF MAGNESIA) suspension 30 mL   AND  Linked Order Group    OLANZapine zydis (ZYPREXA) disintegrating tablet 5 mg    LORazepam (ATIVAN) tablet 1 mg    ziprasidone (GEODON) injection 20 mg   amLODipine (NORVASC) tablet 10 mg   atorvastatin (LIPITOR) tablet 10 mg   fluticasone furoate-vilanterol (BREO ELLIPTA) 200-25 MCG/ACT 1 puff   carvedilol (COREG) tablet 12.5 mg   ondansetron (ZOFRAN) tablet 4 mg       Recommendations  Based on my evaluation the patient appears to have an emergency medical condition for which I recommend the patient be transferred to the emergency department for further evaluation.  Sindy Guadeloupe, NP 02/08/23  5:49 AM

## 2023-02-08 NOTE — ED Notes (Signed)
Pt sleeping@this time. Breathing even and unlabored. Will continue to monitor for safety 

## 2023-02-08 NOTE — ED Notes (Signed)
Writer tried to obtain blood twice could not get it called RN Latricia she stuck twice and could not obtain it will let day shift know of unable to get blood draw

## 2023-02-08 NOTE — ED Notes (Signed)
Pt currently sleeping.  Breathing even and unlabored.  Staff will cont to monitor for safety. 

## 2023-02-08 NOTE — BH Assessment (Signed)
Comprehensive Clinical Assessment (CCA) Note  02/08/2023 Paul Donaldson [id number]  Disposition: Sindy Guadeloupe, NP recommends continuous assessment, to be reassessed tomorrow.   The patient demonstrates the following risk factors for suicide: Chronic risk factors for suicide include: substance use disorder. Acute risk factors for suicide include: social withdrawal/isolation. Protective factors for this patient include: hope for the future. Considering these factors, the overall suicide risk at this point appears to be low. Patient is not appropriate for outpatient follow up.  Paul Donaldson is a 72 year old single male who presents voluntarily to [facility] due to suicidal ideation with no plan and for substance use treatment. Patient denies any mental health history. Patient reports he has been having suicidal thoughts for two to three weeks. He denies any previous suicide attempts or inpatient hospitalizations. Patient states he has also been smoking cocaine. Patient reports he used three to four grams of cocaine today. Patient says he relapsed two years ago, after being clean for twenty-six years.  He reports also using marijuana daily. Patient denies any recent manic symptoms. Patient denies HI, auditory or visual hallucinations.   Patient identifies his primary stressor as homelessness. Patient reports he sold his home for $50k and used all the money on drugs over a one-month period. 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 He states he has no supports, due to his relationship with family being strained. Patient denies history of abuse or trauma. Patient reports having access to a gun "from anyone on the streets".  Patient states he is not receiving any mental health services.  Patient has a history of high blood pressure and reports he has not taken his medication in more than a week.   Patient is dressed casually, alert, and oriented x4 with normal speech. Patient has good eye contact  and there is no indication he is responding to internal stimuli. Patient was calm and cooperative throughout the assessment.   Chief Complaint:  Chief Complaint  Patient presents with   Addiction Problem   suicidal ideation   Visit Diagnosis: Cocaine abuse Suicidal ideation    CCA Screening, Triage and Referral (STR)  Patient Reported Information How did you hear about [place]? Self  What Is the Reason for Your Visit/Call Today? Pt presents to [facility] voluntarily, unaccompanied with complaint of suicidal ideations, with no plan or intent and substance abuse treatment. Pt is experiencing homelessness at this time and that is an immediate stressor for him. Pt is using cocaine daily and has been for about a year and half. Pt last used earlier today around 5-6pm. Pt denies HI, AVH.  How Long Has This Been Causing You Problems? > than 6 months  What Do You Feel Would Help You the Most Today? Alcohol or Drug Use Treatment; Treatment for Depression or other mood problem   Have You Recently Had Any Thoughts About Hurting Yourself? Yes  Are You Planning to Commit Suicide/Harm Yourself At This time? No   Flowsheet Row ED from 02/08/2023 in [facility] from 11/17/2022 in [facility] Emergency Department at [facility]  C-SSRS RISK CATEGORY Low Risk No Risk       Have you Recently Had Thoughts About Hurting Someone Paul Donaldson? No  Are You Planning to Harm Someone at This Time? No  Explanation: N/A   Have You Used Any Alcohol or Drugs in the Past 24 Hours? Yes  What Did You Use and How Much? Cocaine and marijuana   Do You Currently  Have a Therapist/Psychiatrist? No  Name of Therapist/Psychiatrist: Name of Therapist/Psychiatrist: N/A   Have You Been Recently Discharged From Any Office Practice or Programs? No  Explanation of Discharge From Practice/Program: N/A     CCA Screening Triage Referral Assessment Type of Contact:  Face-to-Face  Telemedicine Service Delivery:   Is this Initial or Reassessment?   Date Telepsych consult ordered in [facility]:    Time Telepsych consult ordered in [facility]:    Location of Assessment: [facility]  Provider Location: [facility]   Collateral Involvement: None   Does Patient Have a Automotive engineer Guardian? No  Legal Guardian Contact Information: N/A  Copy of Legal Guardianship Form: -- (N/A)  Legal Guardian Notified of Arrival: -- (N/A)  Legal Guardian Notified of Pending Discharge: -- (N/A)  If Minor and Not Living with Parent(s), Who has Custody? N/A  Is CPS involved or ever been involved? Never  Is APS involved or ever been involved? Never   Patient Determined To Be At Risk for Harm To Self or Others Based on Review of Patient Reported Information or Presenting Complaint? Yes, for Self-Harm (denies HI)  Method: No Plan (denies HI)  Availability of Means: No access or NA (denies HI)  Intent: Vague intent or NA (denies HI)  Notification Required: No need or identified person (denies HI)  Additional Information for Danger to Others Potential: -- (denies HI)  Additional Comments for Danger to Others Potential: denies HI  Are There Guns or Other Weapons in Your Home? No  Types of Guns/Weapons: denies HI  Are These Weapons Safely Secured?                            -- (denies HI)  Who Could Verify You Are Able To Have These Secured: denies HI  Do You Have any Outstanding Charges, Pending Court Dates, Parole/Probation? Patient denies  Contacted To Inform of Risk of Harm To Self or Others: -- (n/a)    Does Patient Present under Involuntary Commitment? No    [place] of Residence: [place]   Patient Currently Receiving the Following Services: Not Receiving Services   Determination of Need: Urgent (48 hours)   Options For Referral: Medication Management; Intensive Outpatient Therapy     CCA  Biopsychosocial Patient Reported Schizophrenia/Schizoaffective Diagnosis in Past: No   Strengths: Unknown   Mental Health Symptoms Depression:   Hopelessness; Worthlessness   Duration of Depressive symptoms:  Duration of Depressive Symptoms: Less than two weeks   Mania:   None   Anxiety:    None   Psychosis:   None   Duration of Psychotic symptoms:    Trauma:   None   Obsessions:   None   Compulsions:   None   Inattention:   None   Hyperactivity/Impulsivity:   None   Oppositional/Defiant Behaviors:   None   Emotional Irregularity:   None   Other Mood/Personality Symptoms:   N/A    Mental Status Exam Appearance and self-care  Stature:   Tall   Weight:   Thin   Clothing:   Casual   Grooming:   Normal   Cosmetic use:   None   Posture/gait:   Normal   Motor activity:   Not Remarkable   Sensorium  Attention:   Normal   Concentration:   Normal   Orientation:   X5   Recall/memory:   Normal   Affect and Mood  Affect:  Flat   Mood:   Other (Comment) (Calm)   Relating  Eye contact:   Normal   Facial expression:   Responsive   Attitude toward examiner:   Cooperative   Thought and Language  Speech flow:  Normal   Thought content:   Appropriate to Mood and Circumstances   Preoccupation:   None   Hallucinations:   None   Organization:   Linear   Company secretary of Knowledge:   Average   Intelligence:   Above Average   Abstraction:   Normal   Judgement:   Fair   Dance movement psychotherapist:   Adequate   Insight:   Fair   Decision Making:   Normal   Social Functioning  Social Maturity:   Irresponsible   Social Judgement:   Normal   Stress  Stressors:   Housing   Coping Ability:   Human resources officer Deficits:   Responsibility   Supports:   Support needed     Religion: Religion/Spirituality Are You A Religious Person?: Yes What is Your Religious Affiliation?:  Environmental consultant: Leisure / Recreation Do You Have Hobbies?: No  Exercise/Diet: Exercise/Diet Do You Exercise?: No Have You Gained or Lost A Significant Amount of Weight in the Past Six Months?: No Do You Follow a Special Diet?: No Do You Have Any Trouble Sleeping?: No   CCA Employment/Education Employment/Work Situation: Employment / Work Academic librarian Situation: Retired Passenger transport manager has Been Impacted by Current Illness: No Has Patient ever Been in Equities trader?: No  Education: Education Last Grade Completed: 12 Did You Product manager?: Yes What Type of College Degree Do you Have?: Teaching Did You Have An Individualized Education Program (IIEP): No Did You Have Any Difficulty At [employer]?: No Patient's Education Has Been Impacted by Current Illness: No   CCA Family/Childhood History Family and Relationship History: Family history Marital status: Single Does patient have children?: Yes How many children?: 2 How is patient's relationship with their children?: Strained relationship  Childhood History:  Childhood History By whom was/is the patient raised?: Mother Did patient suffer any verbal/emotional/physical/sexual abuse as a child?: No Did patient suffer from severe childhood neglect?: No Has patient ever been sexually abused/assaulted/raped as an adolescent or adult?: No Was the patient ever a victim of a crime or a disaster?: No Witnessed domestic violence?: No Has patient been affected by domestic violence as an adult?: No       CCA Substance Use Alcohol/Drug Use: Alcohol / Drug Use Pain Medications: See MAR Prescriptions: See MAR Over the Counter: See MAR History of alcohol / drug use?: Yes Longest period of sobriety (when/how long): 26 years Negative Consequences of Use: Personal relationships, Financial Withdrawal Symptoms: None Substance #1 Name of Substance 1: Cocaine 1 - Age of First Use: 20s 1 - Amount (size/oz): 3-4  grams 1 - Frequency: Daily 1 - Duration: Ongoing 1 - Last Use / Amount: Today 1 - Method of Aquiring: Purchase 1- Route of Use: Smokes                       ASAM's:  Six Dimensions of Multidimensional Assessment  Dimension 1:  Acute Intoxication and/or Withdrawal Potential:      Dimension 2:  Biomedical Conditions and Complications:      Dimension 3:  Emotional, Behavioral, or Cognitive Conditions and Complications:     Dimension 4:  Readiness to Change:     Dimension 5:  Relapse, Continued use, or Continued  Problem Potential:     Dimension 6:  Recovery/Living Environment:     ASAM Severity Score:    ASAM Recommended Level of Treatment:     Substance use Disorder (SUD)    Recommendations for Services/Supports/Treatments:    Discharge Disposition:    DSM5 Diagnoses: Patient Active Problem List   Diagnosis Date Noted   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   Lower extremity edema 06/23/2020   AKI (acute kidney injury) (HCC) 04/23/2020   Hypertension    Dyslipidemia    Tired 04/15/2020   Moderate persistent asthma without complication 08/22/2017   Medicare annual wellness visit, subsequent 02/04/2016   Need for hepatitis C screening test 02/04/2016   Rectal bleeding 04/14/2014   Nausea alone 04/12/2014   LUQ pain 04/12/2014   Asthma 01/23/2014   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

## 2023-02-08 NOTE — ED Notes (Signed)
Multiple attempts to draw labs were performed by multiple staff. In Rt arm, Left arm and Rt hand.    Small spec  was obtained from Rt hand and placed in lavender and green top tubes which where sent to lab.

## 2023-02-08 NOTE — Progress Notes (Signed)
   02/08/23 0114  [facility] Triage Screening (Walk-ins at [facility] only)  How Did You Hear About [place]? Family/Friend  What Is the Reason for Your Visit/Call Today? Pt presents to [facility] voluntarily, unaccompanied with complaint of suicidal ideations, with no plan or intent and substance abuse treatment. Pt is experiencing homelessness at this time and that is an immediate stressor for him. Pt is using cocaine daily and has been for about a year and half. Pt last used earlier today around 5-6pm.  Pt denies HI, AVH.  How Long Has This Been Causing You Problems? > than 6 months  Have You Recently Had Any Thoughts About Hurting Yourself? Yes  How long ago did you have thoughts about hurting yourself? currently  Are You Planning to Commit Suicide/Harm Yourself At This time? No  Have you Recently Had Thoughts About Hurting Someone [name]? No  Are You Planning To Harm Someone At This Time? No  Are you currently experiencing any auditory, visual or other hallucinations? No  Have You Used Any Alcohol or Drugs in the Past 24 Hours? Yes  How long ago did you use Drugs or Alcohol? earlier today  What Did You Use and How Much? cocaine (unknown amount)  Do you have any current medical co-morbidities that require immediate attention? Yes  Please describe current medical co-morbidities that require immediate attention: Pt has hypertension and current reading is 194/124  Clinician description of patient physical appearance/behavior: pt is calm, cooperative  What Do You Feel Would Help You the Most Today? Treatment for Depression or other mood problem  If access to [facility] was not available, would you have sought care in the Emergency Department? Yes  Determination of Need Urgent (48 hours)  Options For Referral Other: Comment;Outpatient Therapy;Facility-Based Crisis;Medication Management;BH Urgent Care

## 2023-02-08 NOTE — ED Notes (Signed)
Pt allowed for two different staff to draw blood.  Unable to get blood at this time. Provider made aware.

## 2023-02-08 NOTE — ED Provider Notes (Cosign Needed Addendum)
Behavioral Health Progress Note  Date: 02/08/23 Patient Name: Paul Donaldson MRN: [id number] Chief Complaint: "just addiction problems"  Diagnoses:  Final diagnoses:  Cocaine abuse (HCC)  Homelessness unspecified   HPI:  On reassessment, pt reports presenting to facility for "just addiction problems". He states he has been using powder and crack cocaine daily for the past "couple of years", estimates for the past 2 years. He reports using between an 8 ball or 2 balls/day. He states last use was yesterday. Reports affording cocaine "anyway I can". Reports being dropped off to this facility by a friend. Pt states he is interested in residential substance use treatment. He denies use of nicotine products, alcohol, marijuana, other substances.   Pt denies suicidal, homicidal or violent ideations. He denies auditory visual hallucinations or paranoia.   He denies history of non suicidal self injurious behavior, suicide attempt or inpatient psychiatric hospitalization. Pt states he has previously been to [facility], did not like the experience, although did find it helpful.  He reports he has been homeless for the past 3 weeks to 1 month due to cocaine abuse.   PHQ 2-9:  Flowsheet Row ED from 02/08/2023 in [facility]  Thoughts that you would be better off dead, or of hurting yourself in some way More than half the days  PHQ-9 Total Score 17       Flowsheet Row ED from 02/08/2023 in [facility] ED from 11/17/2022 in [facility] Emergency Department at [facility]  C-SSRS RISK CATEGORY Low Risk No Risk       Screenings    Flowsheet Row Most Recent Value  CIWA-Ar Total 1       Total Time spent with patient: 30 minutes  Musculoskeletal  Strength & Muscle Tone: within normal limits Gait & Station: normal Patient leans: N/A  Psychiatric Specialty Exam  Presentation General Appearance:  Other (comment) (dressed in  scrubs)  Eye Contact: Fair  Speech: Clear and Coherent; Normal Rate  Speech Volume: Normal  Handedness: Right   Mood and Affect  Mood: Depressed  Affect: Flat   Thought Process  Thought Processes: Coherent  Descriptions of Associations:Intact  Orientation:Full (Time, Place and Person)  Thought Content:Logical; WDL  Diagnosis of Schizophrenia or Schizoaffective disorder in past: No   Hallucinations:Hallucinations: None  Ideas of Reference:None  Suicidal Thoughts:Suicidal Thoughts: No  Homicidal Thoughts:Homicidal Thoughts: No   Sensorium  Memory: Immediate Fair  Judgment: Intact  Insight: Present   Executive Functions  Concentration: Fair  Attention Span: Fair  Recall: [employer] of Knowledge: Fair  Language: Fair   Psychomotor Activity  Psychomotor Activity: Psychomotor Activity: Normal   Assets  Assets: Communication Skills; Desire for Improvement; Financial Resources/Insurance; Resilience   Sleep  Sleep: Sleep: -- ("not good") Number of Hours of Sleep: "not good"   Nutritional Assessment (For OBS and FBC admissions only) Has the patient had a weight loss or gain of 10 pounds or more in the last 3 months?: No Has the patient had a decrease in food intake/or appetite?: No Does the patient have dental problems?: No Does the patient have eating habits or behaviors that may be indicators of an eating disorder including binging or inducing vomiting?: No Has the patient recently lost weight without trying?: 0 Has the patient been eating poorly because of a decreased appetite?: 0 Malnutrition Screening Tool Score: 0    Physical Exam Eyes:     General: No scleral icterus. Cardiovascular:  Rate and Rhythm: Normal rate.  Pulmonary:     Effort: Pulmonary effort is normal. No respiratory distress.  Neurological:     Mental Status: He is alert and oriented to person, place, and time.  Psychiatric:        Attention and  Perception: Attention and perception normal.        Mood and Affect: Mood is depressed. Affect is flat.        Speech: Speech normal.        Behavior: Behavior normal. Behavior is cooperative.        Thought Content: Thought content normal.        Cognition and Memory: Cognition and memory normal.        Judgment: Judgment normal.    Review of Systems  Constitutional:  Negative for chills and fever.  Respiratory:  Negative for shortness of breath.   Cardiovascular:  Negative for chest pain and palpitations.  Gastrointestinal:  Negative for abdominal pain.  Neurological:  Negative for headaches.  Psychiatric/Behavioral:  Positive for depression and substance abuse.     Blood pressure 131/79, pulse 88, temperature 98 F (36.7 C), temperature source Oral, resp. rate 18, SpO2 97 %. There is no height or weight on file to calculate BMI.  Past Psychiatric History: Cocaine abuse   Is the patient at risk to self? No  Has the patient been a risk to self in the past 6 months? No .    Has the patient been a risk to self within the distant past? No   Is the patient a risk to others? No   Has the patient been a risk to others in the past 6 months? No   Has the patient been a risk to others within the distant past? No   Past Medical History: Asthma, Dyslipidemia, Hypertension, Colon Polyp (tubular adenoma),  Family History: None reported Social History: Homeless  Last Labs:  Admission on 02/08/2023  Component Date Value Ref Range Status   POC Amphetamine UR 02/08/2023 None Detected  NONE DETECTED (Cut Off Level 1000 ng/mL) Preliminary   POC Secobarbital (BAR) 02/08/2023 None Detected  NONE DETECTED (Cut Off Level 300 ng/mL) Preliminary   POC Buprenorphine (BUP) 02/08/2023 None Detected  NONE DETECTED (Cut Off Level 10 ng/mL) Preliminary   POC Oxazepam (BZO) 02/08/2023 None Detected  NONE DETECTED (Cut Off Level 300 ng/mL) Preliminary   POC Cocaine UR 02/08/2023 Positive (A)  NONE DETECTED  (Cut Off Level 300 ng/mL) Preliminary   POC Methamphetamine UR 02/08/2023 None Detected  NONE DETECTED (Cut Off Level 1000 ng/mL) Preliminary   POC Morphine 02/08/2023 None Detected  NONE DETECTED (Cut Off Level 300 ng/mL) Preliminary   POC Methadone UR 02/08/2023 None Detected  NONE DETECTED (Cut Off Level 300 ng/mL) Preliminary   POC Oxycodone UR 02/08/2023 None Detected  NONE DETECTED (Cut Off Level 100 ng/mL) Preliminary   POC Marijuana UR 02/08/2023 None Detected  NONE DETECTED (Cut Off Level 50 ng/mL) Preliminary  Admission on 11/17/2022, Discharged on 11/18/2022  Component Date Value Ref Range Status   SARS Coronavirus 2 by RT PCR 11/17/2022 NEGATIVE  NEGATIVE Final   Comment: (NOTE) SARS-CoV-2 target nucleic acids are NOT DETECTED.  The SARS-CoV-2 RNA is generally detectable in upper respiratory specimens during the acute phase of infection. The lowest concentration of SARS-CoV-2 viral copies this assay can detect is 138 copies/mL. A negative result does not preclude SARS-Cov-2 infection and should not be used as the sole basis for treatment or other  patient management decisions. A negative result may occur with  improper specimen collection/handling, submission of specimen other than nasopharyngeal swab, presence of viral mutation(s) within the areas targeted by this assay, and inadequate number of viral copies(<138 copies/mL). A negative result must be combined with clinical observations, patient history, and epidemiological information. The expected result is Negative.  Fact Sheet for Patients:  BloggerCourse.com  Fact Sheet for Healthcare Providers:  SeriousBroker.it  This test is no                          t yet approved or cleared by the [place] FDA and  has been authorized for detection and/or diagnosis of SARS-CoV-2 by FDA under an Emergency Use Authorization (EUA). This EUA will remain  in effect (meaning this  test can be used) for the duration of the COVID-19 declaration under Section 564(b)(1) of the Act, 21 U.S.C.section 360bbb-3(b)(1), unless the authorization is terminated  or revoked sooner.       Influenza A by PCR 11/17/2022 NEGATIVE  NEGATIVE Final   Influenza B by PCR 11/17/2022 NEGATIVE  NEGATIVE Final   Comment: (NOTE) The Xpert Xpress SARS-CoV-2/FLU/RSV plus assay is intended as an aid in the diagnosis of influenza from Nasopharyngeal swab specimens and should not be used as a sole basis for treatment. Nasal washings and aspirates are unacceptable for Xpert Xpress SARS-CoV-2/FLU/RSV testing.  Fact Sheet for Patients: BloggerCourse.com  Fact Sheet for Healthcare Providers: SeriousBroker.it  This test is not yet approved or cleared by the [place] FDA and has been authorized for detection and/or diagnosis of SARS-CoV-2 by FDA under an Emergency Use Authorization (EUA). This EUA will remain in effect (meaning this test can be used) for the duration of the COVID-19 declaration under Section 564(b)(1) of the Act, 21 U.S.C. section 360bbb-3(b)(1), unless the authorization is terminated or revoked.     Resp Syncytial Virus by PCR 11/17/2022 NEGATIVE  NEGATIVE Final   Comment: (NOTE) Fact Sheet for Patients: BloggerCourse.com  Fact Sheet for Healthcare Providers: SeriousBroker.it  This test is not yet approved or cleared by the [place] FDA and has been authorized for detection and/or diagnosis of SARS-CoV-2 by FDA under an Emergency Use Authorization (EUA). This EUA will remain in effect (meaning this test can be used) for the duration of the COVID-19 declaration under Section 564(b)(1) of the Act, 21 U.S.C. section 360bbb-3(b)(1), unless the authorization is terminated or revoked.  Performed at [facility], [address], [place],  [place] [address]    Sodium 11/17/2022 138  135 - 145 mmol/L Final   Potassium 11/17/2022 4.1  3.5 - 5.1 mmol/L Final   Chloride 11/17/2022 109  98 - 111 mmol/L Final   CO2 11/17/2022 22  22 - 32 mmol/L Final   Glucose, Bld 11/17/2022 91  70 - 99 mg/dL Final   Glucose reference range applies only to samples taken after fasting for at least 8 hours.   BUN 11/17/2022 49 (H)  8 - 23 mg/dL Final   Creatinine, Ser 11/17/2022 4.23 (H)  0.61 - 1.24 mg/dL Final   Calcium 19/14/7829 9.5  8.9 - 10.3 mg/dL Final   GFR, Estimated 11/17/2022 14 (L)  >60 mL/min Final   Comment: (NOTE) Calculated using the CKD-EPI Creatinine Equation (2021)    Anion gap 11/17/2022 7  5 - 15 Final   Performed at [facility], [address] [place], [place] [address]   WBC 11/17/2022 6.5  4.0 - 10.5 K/uL Final   RBC 11/17/2022 4.51  4.22 - 5.81 MIL/uL Final   Hemoglobin 11/17/2022 13.5  13.0 - 17.0 g/dL Final   HCT 16/07/9603 43.1  39.0 - 52.0 % Final   MCV 11/17/2022 95.6  80.0 - 100.0 fL Final   MCH 11/17/2022 29.9  26.0 - 34.0 pg Final   MCHC 11/17/2022 31.3  30.0 - 36.0 g/dL Final   RDW 54/06/8118 15.2  11.5 - 15.5 % Final   Platelets 11/17/2022 206  150 - 400 K/uL Final   nRBC 11/17/2022 0.0  0.0 - 0.2 % Final   Neutrophils Relative % 11/17/2022 72  % Final   Neutro Abs 11/17/2022 4.7  1.7 - 7.7 K/uL Final   Lymphocytes Relative 11/17/2022 14  % Final   Lymphs Abs 11/17/2022 0.9  0.7 - 4.0 K/uL Final   Monocytes Relative 11/17/2022 9  % Final   Monocytes Absolute 11/17/2022 0.6  0.1 - 1.0 K/uL Final   Eosinophils Relative 11/17/2022 5  % Final   Eosinophils Absolute 11/17/2022 0.3  0.0 - 0.5 K/uL Final   Basophils Relative 11/17/2022 0  % Final   Basophils Absolute 11/17/2022 0.0  0.0 - 0.1 K/uL Final   Immature Granulocytes 11/17/2022 0  % Final   Abs Immature Granulocytes 11/17/2022 0.02  0.00 - 0.07 K/uL Final   Performed at [facility], [address], [place],  [place] [address]   Color, Urine 11/17/2022 YELLOW  YELLOW Final   APPearance 11/17/2022 HAZY (A)  CLEAR Final   Specific Gravity, Urine 11/17/2022 1.014  1.005 - 1.030 Final   pH 11/17/2022 5.0  5.0 - 8.0 Final   Glucose, UA 11/17/2022 NEGATIVE  NEGATIVE mg/dL Final   Hgb urine dipstick 11/17/2022 SMALL (A)  NEGATIVE Final   Bilirubin Urine 11/17/2022 NEGATIVE  NEGATIVE Final   Ketones, ur 11/17/2022 NEGATIVE  NEGATIVE mg/dL Final   Protein, ur 95/62/1308 100 (A)  NEGATIVE mg/dL Final   Nitrite 65/78/4696 NEGATIVE  NEGATIVE Final   Leukocytes,Ua 11/17/2022 SMALL (A)  NEGATIVE Final   RBC / HPF 11/17/2022 0-5  0 - 5 RBC/hpf Final   WBC, UA 11/17/2022 6-10  0 - 5 WBC/hpf Final   Bacteria, UA 11/17/2022 RARE (A)  NONE SEEN Final   Squamous Epithelial / HPF 11/17/2022 0-5  0 - 5 /HPF Final   Mucus 11/17/2022 PRESENT   Final   Hyaline Casts, UA 11/17/2022 PRESENT   Final   Performed at [facility], [address], [place], [place] [address]   Opiates 11/17/2022 NONE DETECTED  NONE DETECTED Final   Cocaine 11/17/2022 POSITIVE (A)  NONE DETECTED Final   Benzodiazepines 11/17/2022 NONE DETECTED  NONE DETECTED Final   Amphetamines 11/17/2022 NONE DETECTED  NONE DETECTED Final   Tetrahydrocannabinol 11/17/2022 NONE DETECTED  NONE DETECTED Final   Barbiturates 11/17/2022 NONE DETECTED  NONE DETECTED Final   Comment: (NOTE) DRUG SCREEN FOR MEDICAL PURPOSES ONLY.  IF CONFIRMATION IS NEEDED FOR ANY PURPOSE, NOTIFY LAB WITHIN 5 DAYS.  LOWEST DETECTABLE LIMITS FOR URINE DRUG SCREEN Drug Class                     Cutoff (ng/mL) Amphetamine and metabolites    1000 Barbiturate and metabolites    200 Benzodiazepine                 200 Opiates and metabolites        300 Cocaine and metabolites  300 THC                            50 Performed at [facility], [address] [place], [place] [address]    TSH 11/17/2022 1.247  0.350 - 4.500 uIU/mL Final    Comment: Performed by a 3rd Generation assay with a functional sensitivity of <=0.01 uIU/mL. Performed at [facility], [address] [place], [place] [address]     Allergies: Patient has no known allergies.  Medications:  Facility Ordered Medications  Medication   acetaminophen (TYLENOL) tablet 650 mg   alum & mag hydroxide-simeth (MAALOX/MYLANTA) 200-200-20 MG/5ML suspension 30 mL   OLANZapine zydis (ZYPREXA) disintegrating tablet 5 mg   And   LORazepam (ATIVAN) tablet 1 mg   And   ziprasidone (GEODON) injection 20 mg   amLODipine (NORVASC) tablet 10 mg   atorvastatin (LIPITOR) tablet 10 mg   fluticasone furoate-vilanterol (BREO ELLIPTA) 200-25 MCG/ACT 1 puff   carvedilol (COREG) tablet 12.5 mg   ondansetron (ZOFRAN) tablet 4 mg   PTA Medications  Medication Sig   amLODipine (NORVASC) 10 MG tablet Take 10 mg by mouth daily.   BREO ELLIPTA 200-25 MCG/INH AEPB Inhale 1 puff into the lungs daily.   losartan (COZAAR) 25 MG tablet Take 25 mg by mouth daily.    Long Term Goals: Improvement in symptoms so as ready for discharge  Short Term Goals: Patient will verbalize feelings in meetings with treatment team members., Patient will attend at least of 50% of the groups daily., Pt will complete the PHQ9 on admission, day 3 and discharge., and Patient will take medications as prescribed daily.  Medical Decision Making  Transfer from continuous assessment to facility based crisis initially recommended. Pt remained on obs with plan to seek residential substance use treatment.   Continue current medications Meds ordered this encounter  Medications   acetaminophen (TYLENOL) tablet 650 mg   alum & mag hydroxide-simeth (MAALOX/MYLANTA) 200-200-20 MG/5ML suspension 30 mL   DISCONTD: magnesium hydroxide (MILK OF MAGNESIA) suspension 30 mL   AND Linked Order Group    OLANZapine zydis (ZYPREXA) disintegrating tablet 5 mg    LORazepam (ATIVAN) tablet 1 mg     ziprasidone (GEODON) injection 20 mg   amLODipine (NORVASC) tablet 10 mg   atorvastatin (LIPITOR) tablet 10 mg   fluticasone furoate-vilanterol (BREO ELLIPTA) 200-25 MCG/ACT 1 puff   carvedilol (COREG) tablet 12.5 mg   ondansetron (ZOFRAN) tablet 4 mg    Recommendations  Based on my evaluation the patient does not appear to have an emergency medical condition.  Lauree Chandler, NP 02/08/23  10:05 AM

## 2023-02-08 NOTE — ED Provider Notes (Signed)
Reviewed pt's labwork and EKG with EDP Dr. Theresia Lo. Renal function likely baseline. EKG no significant concerns. Recommend ordering lasix 40mg  once for potassium, repeat lab tomorrow.

## 2023-02-09 DIAGNOSIS — R45851 Suicidal ideations: Secondary | ICD-10-CM | POA: Diagnosis not present

## 2023-02-09 MED ORDER — AMLODIPINE BESYLATE 10 MG PO TABS: 10.0000 mg | ORAL_TABLET | Freq: Every day | ORAL | 0 refills | Status: AC

## 2023-02-09 MED ORDER — ATORVASTATIN CALCIUM 10 MG PO TABS: 10.0000 mg | ORAL_TABLET | Freq: Every day | ORAL | 0 refills | Status: DC

## 2023-02-09 MED ORDER — FLUTICASONE FUROATE-VILANTEROL 200-25 MCG/ACT IN AEPB: 1.0000 | INHALATION_SPRAY | Freq: Every day | RESPIRATORY_TRACT | 0 refills | Status: DC

## 2023-02-09 NOTE — Care Management (Addendum)
OBS Care Managhement   Writer met with the patient and provided him with the phone screening number to call [phone].   Writer informed the NP working with the patient.   9:56am  Writer spoke to the intake counselor who reports that the patient has been accepted to their program.  Per Danna Hefty if the patient is being discharged today then she is abel to arrange for transportation due to another transportation patient cancelling this morning.   Writer will follow up with the NP regarding the patient's discharge date.   10:04am  Writer follow up with [employer] and spoke to [place].  I was able to inform her that the patient will be discharging today.  Writer arranged for the facility to pick up the patient today between 1:30-2pm.  Per Danna Hefty, written prescription or filled medication sent with the patient is acceptable.  The number to give report is [phone].    Writer informed the NP and the RN working with the patient.

## 2023-02-09 NOTE — ED Notes (Signed)
Pt resting quietly with eyes closed.  No pain or discomfort noted/voiced.  Breathing is even and unlabored.  Will continue to monitor for safety.  

## 2023-02-09 NOTE — ED Notes (Signed)
Pt resting quietly, breathing is even and unlabored.  Pt denies SI, HI, pain and AVH. Pt  stated his stomach is upset, medication provided to assist with discomfort. Pt reports no issues with BM. Will continue to monitor for safety.

## 2023-02-09 NOTE — Discharge Planning (Signed)
LCSW reviewed chart and was informed on yesterday that patient would likely be transferring to the [facility] when stable. Per MD, patient is seeking residential placement once stable for discharge. LCSW sent referral to [facility] for review. LCSW received a phone call on yesterday from [place] in Admissions, and per [place] patient appears to be appropriate for treatment and has been advised to call in to complete phone screening. Number to call is [phone]. Information to be provided to patient once appropriate. No other needs to report at this time.   Fernande Boyden, LCSW Clinical Social Worker [facility] Ph: [phone]

## 2023-02-09 NOTE — ED Notes (Signed)
Attempted blood draw x2 in different locations (hand/ antecubital area) unable to obtain blood.  Provider made aware.

## 2023-02-09 NOTE — ED Notes (Signed)
Spoke with Admissions at [facility] treatment center.  They report they have a bed available today for pt if he can be discharged and transportation arranged.  SW made aware

## 2023-02-09 NOTE — Discharge Instructions (Addendum)

## 2023-02-09 NOTE — ED Provider Notes (Signed)
FBC/OBS ASAP Discharge Summary  Date and Time: 02/09/2023 11:12 AM  Name: Paul Donaldson  MRN:  [id number]   Discharge Diagnoses:  Final diagnoses:  Cocaine abuse (HCC)  Homelessness unspecified   Subjective:   On reassessment pt denies suicidal, homicidal or violent ideations. Pt denies auditory visual hallucinations or paranoia. Pt has been accepted to [facility] for today. Pt verbalized readiness to discharge to [facility]. Reviewed labwork with pt and discussed follow up with outpatient nephrology. Pt verbalized understanding. States he had a nephrologist, Dr. Thedore Mins, although has not seen Dr. Thedore Mins in a while.  Stay Summary:  Pt is a 72 y/o male w/ history of asthma, dyslipidemia, hypertension, colon polyp (tubular adenoma) presenting to [facility] on 02/07/22 for substance use and suicidal ideation, admitted to continuous assessment. Pt interested in residential substance use treatment. Has been accepted to [facility] for today. Will need to follow up with outpatient nephrology.  Total Time spent with patient: 30 minutes  Past Psychiatric History: Cocaine abuse Past Medical History: Asthma, Dyslipidemia, Hypertension, Colon Polyp (tubular adenoma) Family History: None reported Family Psychiatric History: None reported Social History: Homeless Tobacco Cessation:  N/A, patient does not currently use tobacco products  Current Medications:  Current Facility-Administered Medications  Medication Dose Route Frequency Provider Last Rate Last Admin   acetaminophen (TYLENOL) tablet 650 mg  650 mg Oral Q6H PRN Sindy Guadeloupe, NP       alum & mag hydroxide-simeth (MAALOX/MYLANTA) 200-200-20 MG/5ML suspension 30 mL  30 mL Oral Q4H PRN Sindy Guadeloupe, NP   30 mL at 02/09/23 0604   amLODipine (NORVASC) tablet 10 mg  10 mg Oral Daily Sindy Guadeloupe, NP   10 mg at 02/09/23 7829   atorvastatin (LIPITOR) tablet 10 mg  10 mg Oral Daily Sindy Guadeloupe, NP   10  mg at 02/09/23 0921   carvedilol (COREG) tablet 12.5 mg  12.5 mg Oral BID WC Sindy Guadeloupe, NP   12.5 mg at 02/09/23 0757   fluticasone furoate-vilanterol (BREO ELLIPTA) 200-25 MCG/ACT 1 puff  1 puff Inhalation Daily Sindy Guadeloupe, NP   1 puff at 02/09/23 0757   OLANZapine zydis (ZYPREXA) disintegrating tablet 5 mg  5 mg Oral Q8H PRN Sindy Guadeloupe, NP       And   LORazepam (ATIVAN) tablet 1 mg  1 mg Oral PRN Sindy Guadeloupe, NP       And   ziprasidone (GEODON) injection 20 mg  20 mg Intramuscular PRN Sindy Guadeloupe, NP       ondansetron [facility]) tablet 4 mg  4 mg Oral Q8H PRN Sindy Guadeloupe, NP   4 mg at 02/09/23 0604   Current Outpatient Medications  Medication Sig Dispense Refill   amLODipine (NORVASC) 10 MG tablet Take 1 tablet (10 mg total) by mouth daily. 30 tablet 0   atorvastatin (LIPITOR) 10 MG tablet Take 1 tablet (10 mg total) by mouth daily. 30 tablet 0   [START ON 02/10/2023] fluticasone furoate-vilanterol (BREO ELLIPTA) 200-25 MCG/ACT AEPB Inhale 1 puff into the lungs daily. 1 each 0    PTA Medications:  Facility Ordered Medications  Medication   acetaminophen (TYLENOL) tablet 650 mg   alum & mag hydroxide-simeth (MAALOX/MYLANTA) 200-200-20 MG/5ML suspension 30 mL   OLANZapine zydis (ZYPREXA) disintegrating tablet 5 mg   And   LORazepam (ATIVAN) tablet 1 mg   And   ziprasidone (GEODON) injection 20 mg   amLODipine (NORVASC) tablet 10 mg   atorvastatin (LIPITOR) tablet 10 mg  fluticasone furoate-vilanterol (BREO ELLIPTA) 200-25 MCG/ACT 1 puff   carvedilol (COREG) tablet 12.5 mg   ondansetron (ZOFRAN) tablet 4 mg   [COMPLETED] furosemide (LASIX) tablet 40 mg   PTA Medications  Medication Sig   amLODipine (NORVASC) 10 MG tablet Take 1 tablet (10 mg total) by mouth daily.   atorvastatin (LIPITOR) 10 MG tablet Take 1 tablet (10 mg total) by mouth daily.   [START ON 02/10/2023] fluticasone furoate-vilanterol (BREO ELLIPTA) 200-25 MCG/ACT AEPB Inhale 1 puff into the lungs daily.        02/08/2023    9:51 AM 06/23/2020    3:18 PM  Depression screen PHQ 2/9  Decreased Interest 3 0  Down, Depressed, Hopeless 3 0  PHQ - 2 Score 6 0  Altered sleeping 2   Tired, decreased energy 2   Change in appetite 0   Feeling bad or failure about yourself  3   Trouble concentrating 2   Moving slowly or fidgety/restless 0   Suicidal thoughts 2   PHQ-9 Score 17   Difficult doing work/chores Extremely dIfficult     Flowsheet Row ED from 02/08/2023 in [facility] ED from 11/17/2022 in [facility] Emergency Department at [facility]  C-SSRS RISK CATEGORY Low Risk No Risk       Musculoskeletal  Strength & Muscle Tone: within normal limits Gait & Station: normal Patient leans: N/A  Psychiatric Specialty Exam  Presentation  General Appearance:  Other (comment) (dressed in scrubs)  Eye Contact: Fair  Speech: Clear and Coherent; Normal Rate  Speech Volume: Normal  Handedness: Right   Mood and Affect  Mood: Depressed  Affect: Flat   Thought Process  Thought Processes: Coherent  Descriptions of Associations:Intact  Orientation:Full (Time, Place and Person)  Thought Content:Logical; WDL  Diagnosis of Schizophrenia or Schizoaffective disorder in past: No    Hallucinations:Hallucinations: None  Ideas of Reference:None  Suicidal Thoughts:Suicidal Thoughts: No  Homicidal Thoughts:Homicidal Thoughts: No   Sensorium  Memory: Immediate Fair  Judgment: Intact  Insight: Present   Executive Functions  Concentration: Fair  Attention Span: Fair  Recall: [employer] of Knowledge: Fair  Language: Fair   Psychomotor Activity  Psychomotor Activity: Psychomotor Activity: Normal   Assets  Assets: Communication Skills; Desire for Improvement; Financial Resources/Insurance; Resilience   Sleep  Sleep: Sleep: -- ("not good") Number of Hours of Sleep: "not good"   Nutritional Assessment (For  OBS and FBC admissions only) Has the patient had a weight loss or gain of 10 pounds or more in the last 3 months?: No Has the patient had a decrease in food intake/or appetite?: No Does the patient have dental problems?: No Does the patient have eating habits or behaviors that may be indicators of an eating disorder including binging or inducing vomiting?: No Has the patient recently lost weight without trying?: 0 Has the patient been eating poorly because of a decreased appetite?: 0 Malnutrition Screening Tool Score: 0    Physical Exam  Physical Exam Constitutional:      General: He is not in acute distress.    Appearance: He is not ill-appearing, toxic-appearing or diaphoretic.  Eyes:     General: No scleral icterus. Cardiovascular:     Rate and Rhythm: Normal rate.  Pulmonary:     Effort: Pulmonary effort is normal. No respiratory distress.  Skin:    General: Skin is warm and dry.  Neurological:     Mental Status: He is alert and oriented to person,  place, and time.  Psychiatric:        Attention and Perception: Attention and perception normal.        Mood and Affect: Mood is depressed.        Speech: Speech normal.        Behavior: Behavior normal. Behavior is cooperative.        Thought Content: Thought content normal.        Cognition and Memory: Cognition and memory normal.        Judgment: Judgment normal.    Review of Systems  Constitutional:  Negative for chills and fever.  Respiratory:  Negative for shortness of breath.   Cardiovascular:  Negative for chest pain and palpitations.  Gastrointestinal:  Negative for abdominal pain.  Neurological:  Negative for headaches.  Psychiatric/Behavioral:  Positive for depression and substance abuse.    Blood pressure (!) 153/99, pulse 93, temperature 98.2 F (36.8 C), temperature source Oral, resp. rate 19, SpO2 99 %. There is no height or weight on file to calculate BMI.  Demographic Factors:  Male, Low socioeconomic  status, Living alone, and Unemployed  Loss Factors: Financial problems/change in socioeconomic status  Historical Factors: NA  Risk Reduction Factors:   NA  Continued Clinical Symptoms:  Alcohol/Substance Abuse/Dependencies  Cognitive Features That Contribute To Risk:  None    Suicide Risk:  Minimal: No identifiable suicidal ideation.  Patients presenting with no risk factors but with morbid ruminations; may be classified as minimal risk based on the severity of the depressive symptoms  Plan Of Care/Follow-up recommendations:  Pt accepted to [facility] for today. Pt to follow up with outpatient nephrology. Ambulatory referral made today.  Disposition:  Pt accepted to [facility] for today.  Lauree Chandler, NP 02/09/2023, 11:12 AM

## 2023-02-28 ENCOUNTER — Emergency Department (HOSPITAL_COMMUNITY): Payer: Medicare (Managed Care)

## 2023-02-28 ENCOUNTER — Emergency Department (HOSPITAL_COMMUNITY)
Admission: EM | Admit: 2023-02-28 | Discharge: 2023-02-28 | Disposition: A | Discharge status: Home / Self Care | Payer: Medicare (Managed Care) | Attending: Emergency Medicine | Admitting: Emergency Medicine

## 2023-02-28 ENCOUNTER — Other Ambulatory Visit: Payer: Self-pay

## 2023-02-28 ENCOUNTER — Encounter (HOSPITAL_COMMUNITY): Payer: Self-pay

## 2023-02-28 DIAGNOSIS — T50901A Poisoning by unspecified drugs, medicaments and biological substances, accidental (unintentional), initial encounter: Secondary | ICD-10-CM

## 2023-02-28 DIAGNOSIS — R7989 Other specified abnormal findings of blood chemistry: Secondary | ICD-10-CM | POA: Diagnosis not present

## 2023-02-28 DIAGNOSIS — R400 Somnolence: Secondary | ICD-10-CM | POA: Diagnosis present

## 2023-02-28 DIAGNOSIS — T40601A Poisoning by unspecified narcotics, accidental (unintentional), initial encounter: Secondary | ICD-10-CM | POA: Diagnosis not present

## 2023-02-28 LAB — COMPREHENSIVE METABOLIC PANEL
ALT: 32 U/L (ref 0–44)
AST: 20 U/L (ref 15–41)
Albumin: 3.2 g/dL — ABNORMAL LOW (ref 3.5–5.0)
Alkaline Phosphatase: 65 U/L (ref 38–126)
Anion gap: 11 (ref 5–15)
BUN: 40 mg/dL — ABNORMAL HIGH (ref 8–23)
CO2: 22 mmol/L (ref 22–32)
Calcium: 8.5 mg/dL — ABNORMAL LOW (ref 8.9–10.3)
Chloride: 107 mmol/L (ref 98–111)
Creatinine, Ser: 2.75 mg/dL — ABNORMAL HIGH (ref 0.61–1.24)
GFR, Estimated: 23 mL/min — ABNORMAL LOW (ref >60)
Glucose, Bld: 122 mg/dL — ABNORMAL HIGH (ref 70–99)
Potassium: 4.4 mmol/L (ref 3.5–5.1)
Sodium: 140 mmol/L (ref 135–145)
Total Bilirubin: 0.4 mg/dL (ref 0.3–1.2)
Total Protein: 6.6 g/dL (ref 6.5–8.1)

## 2023-02-28 LAB — URINALYSIS, W/ REFLEX TO CULTURE (INFECTION SUSPECTED)
Bacteria, UA: NONE SEEN
Bilirubin Urine: NEGATIVE
Glucose, UA: NEGATIVE mg/dL
Hgb urine dipstick: NEGATIVE
Ketones, ur: NEGATIVE mg/dL
Leukocytes,Ua: NEGATIVE
Nitrite: NEGATIVE
Protein, ur: 100 mg/dL — AB
Specific Gravity, Urine: 1.013 (ref 1.005–1.030)
pH: 5 (ref 5.0–8.0)

## 2023-02-28 LAB — CBC WITH DIFFERENTIAL/PLATELET
Abs Immature Granulocytes: 0.01 10*3/uL (ref 0.00–0.07)
Basophils Absolute: 0 10*3/uL (ref 0.0–0.1)
Basophils Relative: 0 %
Eosinophils Absolute: 0 10*3/uL (ref 0.0–0.5)
Eosinophils Relative: 0 %
HCT: 39.3 % (ref 39.0–52.0)
Hemoglobin: 12.2 g/dL — ABNORMAL LOW (ref 13.0–17.0)
Immature Granulocytes: 0 %
Lymphocytes Relative: 7 %
Lymphs Abs: 0.6 10*3/uL — ABNORMAL LOW (ref 0.7–4.0)
MCH: 29.8 pg (ref 26.0–34.0)
MCHC: 31 g/dL (ref 30.0–36.0)
MCV: 96.1 fL (ref 80.0–100.0)
Monocytes Absolute: 0.8 10*3/uL (ref 0.1–1.0)
Monocytes Relative: 9 %
Neutro Abs: 7.3 10*3/uL (ref 1.7–7.7)
Neutrophils Relative %: 84 %
Platelets: 170 10*3/uL (ref 150–400)
RBC: 4.09 MIL/uL — ABNORMAL LOW (ref 4.22–5.81)
RDW: 15.5 % (ref 11.5–15.5)
WBC: 8.7 10*3/uL (ref 4.0–10.5)
nRBC: 0 % (ref 0.0–0.2)

## 2023-02-28 LAB — TROPONIN I (HIGH SENSITIVITY)
Troponin I (High Sensitivity): 20 ng/L — ABNORMAL HIGH (ref <18)
Troponin I (High Sensitivity): 23 ng/L — ABNORMAL HIGH (ref <18)

## 2023-02-28 LAB — RAPID URINE DRUG SCREEN, HOSP PERFORMED
Amphetamines: POSITIVE — AB
Barbiturates: NOT DETECTED
Benzodiazepines: NOT DETECTED
Cocaine: POSITIVE — AB
Opiates: NOT DETECTED
Tetrahydrocannabinol: NOT DETECTED

## 2023-02-28 LAB — ETHANOL: Alcohol, Ethyl (B): <10 mg/dL (ref <10)

## 2023-02-28 MED ORDER — SODIUM CHLORIDE 0.9 % IV BOLUS
500.0000 mL | Freq: Once | INTRAVENOUS | Status: AC
  Administered 2023-02-28: 500 mL via INTRAVENOUS

## 2023-02-28 NOTE — Discharge Instructions (Addendum)
Your x-ray today indicates she might have aspirated while you are unconscious.  Given you have no symptoms now we typically do not treat with antibiotics.  However if you develop cough, fever, trouble breathing, or any other new/concerning symptoms and return to the ER or call 911.

## 2023-02-28 NOTE — ED Provider Notes (Signed)
[place] EMERGENCY DEPARTMENT AT [facility] Provider Note   CSN: [id number] Arrival date & time: 02/28/23  4098     History  Chief Complaint  Patient presents with   Drug Overdose    Paul Donaldson is a 72 y.o. male.  HPI 72 year old male presents via EMS after being found unresponsive by roommates.  Patient states he snorted drugs last night which she thought was cocaine.  This morning his roommate found him unresponsive and after calling 911 they indicate he should do chest compressions.  Roommate was doing chest compressions until EMS arrived.  Patient was somnolent and they gave 2 mg IM Narcan which has awoken the patient.  Patient denies any sort of pain including chest pain.  EMS reported to the nurse that he had some right-sided abdominal pain but he currently denies this.  He otherwise denies any acute complaints.  Home Medications Prior to Admission medications   Medication Sig Start Date End Date Taking? Authorizing Provider  amLODipine (NORVASC) 10 MG tablet Take 1 tablet (10 mg total) by mouth daily. 02/09/23 03/11/23  Lauree Chandler, NP  atorvastatin (LIPITOR) 10 MG tablet Take 1 tablet (10 mg total) by mouth daily. 02/09/23   Lauree Chandler, NP  fluticasone furoate-vilanterol (BREO ELLIPTA) 200-25 MCG/ACT AEPB Inhale 1 puff into the lungs daily. 02/10/23   Lauree Chandler, NP      Allergies    Patient has no known allergies.    Review of Systems   Review of Systems  Respiratory:  Negative for shortness of breath.   Cardiovascular:  Negative for chest pain.  Gastrointestinal:  Negative for abdominal pain.  Neurological:  Negative for headaches.    Physical Exam Updated Vital Signs BP (!) 155/111   Pulse 100   Temp 97.7 F (36.5 C) (Oral)   Resp 16   Ht 6\' 3"  (1.905 m)   Wt 90.7 kg   SpO2 100%   BMI 24.99 kg/m  Physical Exam Vitals and nursing note reviewed.  Constitutional:      General: He is not in acute distress.     Appearance: He is well-developed. He is not ill-appearing or diaphoretic.     Comments: Patient is alert. He will close his eyes when not being talked to, but easily awakens to voice.  HENT:     Head: Normocephalic and atraumatic.  Eyes:     Extraocular Movements: Extraocular movements intact.     Comments: Pinpoint pupils bilaterally  Cardiovascular:     Rate and Rhythm: Regular rhythm. Tachycardia present.     Heart sounds: Normal heart sounds.     Comments: HR low 100s Pulmonary:     Effort: Pulmonary effort is normal.     Breath sounds: Normal breath sounds.  Chest:     Chest wall: No tenderness.  Abdominal:     Palpations: Abdomen is soft.     Tenderness: There is no abdominal tenderness.  Musculoskeletal:     Cervical back: No rigidity.  Skin:    General: Skin is warm and dry.  Neurological:     Mental Status: He is alert and oriented to person, place, and time.     Comments: CN 3-12 grossly intact. 5/5 strength in all 4 extremities. Grossly normal sensation. Normal finger to nose.      ED Results / Procedures / Treatments   Labs (all labs ordered are listed, but only abnormal results are displayed) Labs Reviewed  RAPID URINE DRUG SCREEN, HOSP  PERFORMED - Abnormal; Notable for the following components:      Result Value   Cocaine POSITIVE (*)    Amphetamines POSITIVE (*)    All other components within normal limits  URINALYSIS, W/ REFLEX TO CULTURE (INFECTION SUSPECTED) - Abnormal; Notable for the following components:   APPearance HAZY (*)    Protein, ur 100 (*)    All other components within normal limits  COMPREHENSIVE METABOLIC PANEL - Abnormal; Notable for the following components:   Glucose, Bld 122 (*)    BUN 40 (*)    Creatinine, Ser 2.75 (*)    Calcium 8.5 (*)    Albumin 3.2 (*)    GFR, Estimated 23 (*)    All other components within normal limits  CBC WITH DIFFERENTIAL/PLATELET - Abnormal; Notable for the following components:   RBC 4.09 (*)     Hemoglobin 12.2 (*)    Lymphs Abs 0.6 (*)    All other components within normal limits  TROPONIN I (HIGH SENSITIVITY) - Abnormal; Notable for the following components:   Troponin I (High Sensitivity) 20 (*)    All other components within normal limits  TROPONIN I (HIGH SENSITIVITY) - Abnormal; Notable for the following components:   Troponin I (High Sensitivity) 23 (*)    All other components within normal limits  ETHANOL    EKG EKG Interpretation  Date/Time:  Monday Feb 28 2023 08:18:47 EDT Ventricular Rate:  101 PR Interval:  124 QRS Duration: 81 QT Interval:  341 QTC Calculation: 442 R Axis:   19 Text Interpretation: Sinus tachycardia Probable left atrial enlargement Abnormal R-wave progression, early transition Probable left ventricular hypertrophy Borderline T abnormalities, diffuse leads  ST/T changes similar to Feb 08 2023 Confirmed by Pricilla Loveless [phone]) on 02/28/2023 8:27:21 AM  Radiology DG Chest Portable 1 View  Result Date: 02/28/2023 CLINICAL DATA:  72 year old male history of drug overdose. Status post CPR. EXAM: PORTABLE CHEST 1 VIEW COMPARISON:  No priors. FINDINGS: Poorly defined opacity in the medial aspect of the lung bases bilaterally. No pleural effusions. No pneumothorax. No evidence of pulmonary edema. Heart size is normal. 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 IMPRESSION: 1. Poorly defined opacities in the medial aspects of the lung bases bilaterally, which could reflect sequela of recent aspiration. 2. 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 Electronically Signed   By: Trudie Reed M.D.   On: 02/28/2023 08:50    Procedures Procedures    Medications Ordered in ED Medications  sodium chloride 0.9 % bolus 500 mL (0 mLs Intravenous Stopped 02/28/23 0944)  sodium chloride 0.9 % bolus 500 mL (0 mLs Intravenous Stopped 02/28/23 1246)    ED Course/ Medical Decision Making/ A&P                             Medical Decision  Making Amount and/or Complexity of Data Reviewed Labs: ordered.    Details: Troponins minimally elevated but flat and likely from his CKD which is better than baseline.  UDS positive for cocaine and amphetamines Radiology: ordered and independent interpretation performed.    Details: Questionable aspiration ECG/medicine tests: ordered and independent interpretation performed.    Details: Sinus tachycardia but no ischemia.   Patient presents with accidental overdose, likely opiate given the response to Narcan.  Patient states he did not intend to use opiates and was trying to snort what he thought was cocaine.  He has been sleepy here but no indication  for recurrent Narcan dosing.  He was allowed to rest and was observed here and now he is fully awake and able to eat.  He has CKD that seems better than baseline.  At this point, I do not think he needs admission.  Troponins are minimally elevated but flat and likely from the kidney disease.  Questionable aspiration on the x-ray but he has no complaints, normal sats, no cough.  Could be aspiration but at this point I think we would observe rather than putting on antibiotics.  Discussed this with patient and indicates he needs to come back if he develops symptoms.  He states this was not a suicide attempt and was completely accidental.       Final Clinical Impression(s) / ED Diagnoses Final diagnoses:  Accidental overdose, initial encounter    Rx / DC Orders ED Discharge Orders     None         Pricilla Loveless, MD 02/28/23 1516

## 2023-02-28 NOTE — ED Notes (Signed)
PT has not been able to give a urine sample due to him falling in and out of sleep

## 2023-02-28 NOTE — ED Triage Notes (Signed)
PT brought in by [facility] after having snorted something last night and was found unresponsive this am.Roommate called 911 and was instructed on how to do chest compressions.Compressions were done until EMS arrived.EMS gave the PT 2mg  Narcan which revived him.PT denies CP but c/o of tenderness in right quadrant.

## 2023-03-05 ENCOUNTER — Ambulatory Visit (HOSPITAL_COMMUNITY)
Admission: EM | Admit: 2023-03-05 | Discharge: 2023-03-05 | Disposition: A | Discharge status: Home / Self Care | Payer: Medicare (Managed Care) | Attending: Psychiatry | Admitting: Psychiatry

## 2023-03-05 DIAGNOSIS — Z5900 Homelessness unspecified: Secondary | ICD-10-CM | POA: Diagnosis not present

## 2023-03-05 DIAGNOSIS — F1410 Cocaine abuse, uncomplicated: Secondary | ICD-10-CM | POA: Diagnosis present

## 2023-03-05 NOTE — Discharge Instructions (Signed)

## 2023-03-05 NOTE — Progress Notes (Signed)
   03/05/23 1250  [facility] Triage Screening (Walk-ins at [facility] only)  How Did You Hear About [place]? Self  What Is the Reason for Your Visit/Call Today? Patient is a 72 year old male that presents voluntary as a walk in to [facility] requesting assistance with ongoing SA issues. Patient denies any S/I, H/I or AVH this date. Patient denies having a current OP provider/counselor to assist with ongoing drug use. patient denies a prior mental health diagnosis. Patient states he is currently homeless and has been using 2 to 3 grams of cocaine daily for the last year. Patient was seen at [facility] on 5/7 when he presented with similar symptoms and was recommended for continuous assessment and was accepted to [facility] where he stayed for three weeks. Patient was recently discharged 3 days ago and has since relapsed. Patient states he has been using 3 to 4 grams daily since then. Patient is requesting assistance with ongoing use.  How Long Has This Been Causing You Problems? > than 6 months  Have You Recently Had Any Thoughts About Hurting Yourself? No  How long ago did you have thoughts about hurting yourself? NA  Are You Planning to Commit Suicide/Harm Yourself At This time? No  Have you Recently Had Thoughts About Hurting Someone [name]? No  Are You Planning To Harm Someone At This Time? No  Explanation: NA  Are you currently experiencing any auditory, visual or other hallucinations? No  Have You Used Any Alcohol or Drugs in the Past 24 Hours? Yes  How long ago did you use Drugs or Alcohol? Patient reports using a gram of cocaine in the last 12 hours  What Did You Use and How Much? 1 gram  Do you have any current medical co-morbidities that require immediate attention? No  Please describe current medical co-morbidities that require immediate attention: NA  Clinician description of patient physical appearance/behavior: Patient presents with a pleasant affect and is cooperative  What Do You Feel Would Help  You the Most Today? Treatment for Depression or other mood problem  If access to [facility] was not available, would you have sought care in the Emergency Department? No  Determination of Need Routine (7 days)  Options For Referral Outpatient Therapy

## 2023-03-05 NOTE — ED Provider Notes (Addendum)
Behavioral [facility] Urgent Care Medical Screening Exam  Patient Name: Paul Donaldson MRN: [id number] Date of Evaluation: 03/19/23 Chief Complaint:   Diagnosis:  Final diagnoses:  Cocaine abuse (HCC)  Cocaine use disorder (HCC)    History of Present illness: Paul Donaldson is a 72 y.o. male.   Patient presents voluntarily to [facility] reporting that "I need help with my addiction".  He reports that he has a problem of abusing cocaine. He reports that he was recently discharged from [facility] where he stayed for 3 weeks. Upon discharge, patient relapsed on cocaine and has been using since. On 02/03/2023, patient presented to [facility] ED reporting overdose on cocaine. He was treated and discharged. On 02/08/23, patient was evaluated here and was transferred to [facility] for treatment. Patient reports that "they discharged me before I get better, they just love money, they have no heart". He reports using 3-4 grams of cocaine on daily basis.   Patient denies SI/HI/AVH. He denies other mental illnesses and states "send me to [facility], that is where I need to be".  Patient reports that he is homeless. He has no outpatient services and reports that all he needs is to be admitted to [facility].   Assessment: Paul Donaldson is a 72 year-old male sitting in the assessment room. He is receptive upon approach and able to express his feelings, needs and concerns. He is alert and oriented x 4. He denies SI/HI/AVH. Denies hx of mental illnesses and states "I just have addiction". Patient is casually dressed. He appears to be well nourished and his hygiene is decent. He does not appear to be responding to internal stimuli. Patient denies health issues. Denies headache/dizziness. Denies hx of seizures or syncope. Denies neck, chest/back pain. Denies muscle/joint pain.  He admits to using cocaine on daily basis. Has received treatments before with no permanent sobriety.  Patient reports he is  currently homeless with no support system. He denies use of any other substances.   I  coordinated with TTS to provide  helpful resources. Patient was encouraged to join support groups to address his addiction problem. He was also encouraged to see his PCP to address any health concerns.     Flowsheet Row ED from 03/05/2023 in [facility] ED from 02/28/2023 in [facility] Emergency Department at [facility] ED from 02/08/2023 in [facility]  C-SSRS RISK CATEGORY Error: Q3, 4, or 5 should not be populated when Q2 is No No Risk Low Risk       Psychiatric Specialty Exam  Presentation  General Appearance:Casual  Eye Contact:Fair  Speech:Clear and Coherent  Speech Volume:Normal  Handedness:Right   Mood and Affect  Mood: Depressed  Affect: Congruent   Thought Process  Thought Processes: Coherent  Descriptions of Associations:Intact  Orientation:Full (Time, Place and Person)  Thought Content:Logical  Diagnosis of Schizophrenia or Schizoaffective disorder in past: No   Hallucinations:None  Ideas of Reference:None  Suicidal Thoughts:No With Intent; With Plan  Homicidal Thoughts:No   Sensorium  Memory: Immediate Fair; Recent Fair; Remote Fair  Judgment: Fair  Insight: Fair   Chartered certified accountant: Fair  Attention Span: Fair  Recall: [employer] of Knowledge: Fair  Language: Fair   Psychomotor Activity  Psychomotor Activity: Normal   Assets  Assets: Manufacturing systems engineer; Desire for Improvement; Financial Resources/Insurance   Sleep  Sleep: Poor  Number of hours:  4   Physical Exam: Physical Exam Constitutional:  Appearance: Normal appearance.  HENT:     Head: Normocephalic and atraumatic.     Right Ear: Tympanic membrane normal.     Left Ear: Tympanic membrane normal.     Nose: Nose normal.  Cardiovascular:     Rate and Rhythm: Normal rate.   Pulmonary:     Effort: Pulmonary effort is normal.  Musculoskeletal:        General: Normal range of motion.     Cervical back: Normal range of motion and neck supple.  Skin:    General: Skin is dry.  Neurological:     General: No focal deficit present.     Mental Status: He is alert and oriented to person, place, and time.  Psychiatric:        Thought Content: Thought content normal.    Review of Systems  Constitutional: Negative.   HENT: Negative.    Eyes: Negative.   Respiratory: Negative.    Cardiovascular: Negative.   Gastrointestinal: Negative.   Genitourinary: Negative.   Musculoskeletal: Negative.   Skin: Negative.   Neurological: Negative.   Endo/Heme/Allergies: Negative.   Psychiatric/Behavioral:  Positive for substance abuse.    Blood pressure (!) 177/101, pulse (!) 103, temperature 98.5 F (36.9 C), temperature source Oral, resp. rate 18, SpO2 100 %. There is no height or weight on file to calculate BMI.  Musculoskeletal: Strength & Muscle Tone: within normal limits Gait & Station: normal Patient leans: N/A   [facility] MSE Discharge Disposition for Follow up and Recommendations: Based on my evaluation the patient does not appear to have an emergency medical condition and can be discharged with resources and follow up care in outpatient services for Substance Abuse Intensive Outpatient Program, Individual Therapy, and Group Therapy  Discharged with helpful resources.     Olin Pia, NP 03/19/2023, 8:02 AM

## 2023-08-23 ENCOUNTER — Encounter: Payer: Self-pay | Admitting: Nurse Practitioner

## 2023-08-23 ENCOUNTER — Ambulatory Visit: Payer: Self-pay | Admitting: Nurse Practitioner

## 2023-08-23 VITALS — BP 187/98 | HR 94 | Temp 98.2°F | Resp 20 | Ht 75.0 in

## 2023-08-23 DIAGNOSIS — I10 Essential (primary) hypertension: Secondary | ICD-10-CM
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
## 2023-08-30 ENCOUNTER — Ambulatory Visit: Payer: Self-pay | Admitting: Nurse Practitioner

## 2023-08-30 VITALS — BP 146/84 | HR 73 | Temp 98.0°F | Resp 16 | Ht 75.0 in | Wt 186.4 lb

## 2023-08-30 DIAGNOSIS — Z23 Encounter for immunization: Secondary | ICD-10-CM

## 2023-08-30 NOTE — Patient Instructions (Signed)
Go to [facility] on 09/02/23 at 11:40 for Primary Care appt.

## 2023-08-30 NOTE — Progress Notes (Signed)
Appt for [facility] 09/02/23 at 10:40

## 2023-09-15 ENCOUNTER — Encounter: Payer: Self-pay | Admitting: *Deleted

## 2023-09-15 NOTE — Congregational Nurse Program (Signed)
  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 Yes    Student Assistance N/A    Location Patient Served  [facility]    Encounter Setting CN site;Phone/Text/Email    Population Status Unhoused    Insurance [employer]    Insurance/Financial Assistance Referral N/A    Medication N/A;Have Medication Insecurities;Provided Medication Assistance    Medical Provider No    Screening Referrals Made N/A    Medical Referrals Made N/A    Medical Appointment Completed N/A    CNP Interventions Advocate/Support;Case Management    Screenings CN Performed N/A    ED Visit Averted N/A    Life-Saving Intervention Made N/A            Client requested assistance getting blood pressure medication from [facility] pharmacy. Client reports he does not have a phone or transportation to get medication. Contacted [facility] pharmacy per client request and client has a refill of blood pressure medication available this afternoon after 4:00. Gave bus passes for transportation.  Allysa Governale W RN CN

## 2023-09-26 ENCOUNTER — Encounter: Payer: Self-pay | Admitting: *Deleted

## 2023-09-26 NOTE — Congregational Nurse Program (Signed)
  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 Yes    Student Assistance N/A    Location Patient Served  [facility]    Encounter Setting CN site;Phone/Text/Email    Population Status Unhoused    Insurance [employer]    Insurance/Financial Assistance Referral N/A    Medication Have Medication Insecurities    Medical Provider No    Screening Referrals Made N/A    Medical Referrals Made N/A    Medical Appointment Completed N/A    CNP Interventions Advocate/Support    Screenings CN Performed Blood Pressure    ED Visit Averted N/A    Life-Saving Intervention Made N/A            Client came to nurse's office asking about help with medications. Checked vitals. Blood pressure (!) 156/81, pulse 69, SpO2 98%. He said he had some medications but would need more over the holidays. Contacted [employer] [address] and client has four medications ready with a cost of $9.76. Information given to client and he reports not having any money to pay for medication. Told client writer would check and let pt know if there was assistance available. Client has insurance and is not eligible for [place] assistance. Tried to locate client to see if he wanted medication switched to [facility] pharmacy and placed on an account to pay later. Writer tried to locate client multiple times and called number in epic but could not locate client at [employer]. Aolanis Crispen W RN CN

## 2023-11-05 ENCOUNTER — Emergency Department (HOSPITAL_COMMUNITY)
Admission: EM | Admit: 2023-11-05 | Discharge: 2023-11-05 | Disposition: A | Discharge status: Home / Self Care | Payer: Medicare (Managed Care) | Source: Home / Self Care | Attending: Student | Admitting: Student

## 2023-11-05 ENCOUNTER — Emergency Department (HOSPITAL_COMMUNITY): Payer: Medicare (Managed Care)

## 2023-11-05 ENCOUNTER — Other Ambulatory Visit: Payer: Self-pay

## 2023-11-05 DIAGNOSIS — J4541 Moderate persistent asthma with (acute) exacerbation: Secondary | ICD-10-CM | POA: Insufficient documentation

## 2023-11-05 DIAGNOSIS — Z87891 Personal history of nicotine dependence: Secondary | ICD-10-CM | POA: Insufficient documentation

## 2023-11-05 DIAGNOSIS — Z79899 Other long term (current) drug therapy: Secondary | ICD-10-CM | POA: Insufficient documentation

## 2023-11-05 DIAGNOSIS — I10 Essential (primary) hypertension: Secondary | ICD-10-CM | POA: Insufficient documentation

## 2023-11-05 DIAGNOSIS — J441 Chronic obstructive pulmonary disease with (acute) exacerbation: Secondary | ICD-10-CM

## 2023-11-05 DIAGNOSIS — I468 Cardiac arrest due to other underlying condition: Secondary | ICD-10-CM | POA: Diagnosis not present

## 2023-11-05 DIAGNOSIS — T405X1A Poisoning by cocaine, accidental (unintentional), initial encounter: Secondary | ICD-10-CM | POA: Diagnosis not present

## 2023-11-05 LAB — TROPONIN I (HIGH SENSITIVITY): Troponin I (High Sensitivity): 14 ng/L (ref <18)

## 2023-11-05 MED ORDER — PREDNISONE 10 MG PO TABS
40.0000 mg | ORAL_TABLET | Freq: Every day | ORAL | 0 refills | Status: AC
Start: 2023-11-05 — End: 2023-11-09

## 2023-11-05 MED ORDER — ALBUTEROL SULFATE HFA 108 (90 BASE) MCG/ACT IN AERS
1.0000 | INHALATION_SPRAY | Freq: Once | RESPIRATORY_TRACT | Status: AC
  Administered 2023-11-05: 1 via RESPIRATORY_TRACT
  Filled 2023-11-05: qty 6.7

## 2023-11-05 MED ORDER — ALBUTEROL SULFATE (2.5 MG/3ML) 0.083% IN NEBU
10.0000 mg | INHALATION_SOLUTION | Freq: Once | RESPIRATORY_TRACT | Status: AC
  Administered 2023-11-05: 10 mg via RESPIRATORY_TRACT
  Filled 2023-11-05: qty 12

## 2023-11-05 MED ORDER — BUDESONIDE-FORMOTEROL FUMARATE 80-4.5 MCG/ACT IN AERO: 2.0000 | INHALATION_SPRAY | Freq: Two times a day (BID) | RESPIRATORY_TRACT | 12 refills | Status: DC
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
## 2023-11-05 NOTE — ED Notes (Signed)
Lab states this patient's labs clotted. Phlebotomist tried to go in room to re-stick patient and patient declined labs, stating "I don't like being stuck". MD aware.

## 2023-11-05 NOTE — ED Notes (Addendum)
Patient refused lab work at this time, Charity fundraiser and MD made aware, will try again later.

## 2023-11-05 NOTE — ED Provider Notes (Signed)
STEMI page for abn ECG -different from prior   Gerhard Munch, MD 11/05/23 1859

## 2023-11-05 NOTE — ED Notes (Signed)
Lab called an hour after labs were initially collected to state the CMP was hemolyzed.
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
## 2023-11-07 ENCOUNTER — Encounter (HOSPITAL_COMMUNITY): Payer: Self-pay | Admitting: Critical Care Medicine

## 2023-11-07 ENCOUNTER — Inpatient Hospital Stay (HOSPITAL_COMMUNITY): Payer: Medicare (Managed Care)

## 2023-11-07 ENCOUNTER — Emergency Department (HOSPITAL_COMMUNITY): Payer: Medicare (Managed Care)

## 2023-11-07 ENCOUNTER — Inpatient Hospital Stay (HOSPITAL_COMMUNITY)
Admission: EM | Admit: 2023-11-07 | Discharge: 2023-12-03 | DRG: 917 | Disposition: E | Payer: Medicare (Managed Care) | Attending: Internal Medicine | Admitting: Internal Medicine

## 2023-11-07 DIAGNOSIS — D689 Coagulation defect, unspecified: Secondary | ICD-10-CM | POA: Diagnosis present

## 2023-11-07 DIAGNOSIS — I129 Hypertensive chronic kidney disease with stage 1 through stage 4 chronic kidney disease, or unspecified chronic kidney disease: Secondary | ICD-10-CM | POA: Diagnosis present

## 2023-11-07 DIAGNOSIS — R739 Hyperglycemia, unspecified: Secondary | ICD-10-CM | POA: Diagnosis present

## 2023-11-07 DIAGNOSIS — T405X1A Poisoning by cocaine, accidental (unintentional), initial encounter: Secondary | ICD-10-CM | POA: Diagnosis present

## 2023-11-07 DIAGNOSIS — Y92481 Parking lot as the place of occurrence of the external cause: Secondary | ICD-10-CM | POA: Diagnosis not present

## 2023-11-07 DIAGNOSIS — G9340 Encephalopathy, unspecified: Secondary | ICD-10-CM | POA: Diagnosis not present

## 2023-11-07 DIAGNOSIS — Z7952 Long term (current) use of systemic steroids: Secondary | ICD-10-CM

## 2023-11-07 DIAGNOSIS — Z860100 Personal history of colon polyps, unspecified: Secondary | ICD-10-CM

## 2023-11-07 DIAGNOSIS — E875 Hyperkalemia: Secondary | ICD-10-CM | POA: Diagnosis present

## 2023-11-07 DIAGNOSIS — I468 Cardiac arrest due to other underlying condition: Secondary | ICD-10-CM | POA: Diagnosis present

## 2023-11-07 DIAGNOSIS — D539 Nutritional anemia, unspecified: Secondary | ICD-10-CM | POA: Diagnosis present

## 2023-11-07 DIAGNOSIS — J9602 Acute respiratory failure with hypercapnia: Secondary | ICD-10-CM | POA: Diagnosis present

## 2023-11-07 DIAGNOSIS — R569 Unspecified convulsions: Secondary | ICD-10-CM | POA: Diagnosis present

## 2023-11-07 DIAGNOSIS — G936 Cerebral edema: Secondary | ICD-10-CM | POA: Diagnosis not present

## 2023-11-07 DIAGNOSIS — N184 Chronic kidney disease, stage 4 (severe): Secondary | ICD-10-CM | POA: Diagnosis present

## 2023-11-07 DIAGNOSIS — I2489 Other forms of acute ischemic heart disease: Secondary | ICD-10-CM | POA: Diagnosis present

## 2023-11-07 DIAGNOSIS — Z7189 Other specified counseling: Secondary | ICD-10-CM

## 2023-11-07 DIAGNOSIS — G931 Anoxic brain damage, not elsewhere classified: Secondary | ICD-10-CM | POA: Diagnosis present

## 2023-11-07 DIAGNOSIS — Z66 Do not resuscitate: Secondary | ICD-10-CM | POA: Diagnosis not present

## 2023-11-07 DIAGNOSIS — J690 Pneumonitis due to inhalation of food and vomit: Secondary | ICD-10-CM | POA: Diagnosis not present

## 2023-11-07 DIAGNOSIS — G935 Compression of brain: Secondary | ICD-10-CM | POA: Diagnosis not present

## 2023-11-07 DIAGNOSIS — R001 Bradycardia, unspecified: Secondary | ICD-10-CM | POA: Diagnosis present

## 2023-11-07 DIAGNOSIS — Z515 Encounter for palliative care: Secondary | ICD-10-CM | POA: Diagnosis not present

## 2023-11-07 DIAGNOSIS — E785 Hyperlipidemia, unspecified: Secondary | ICD-10-CM | POA: Diagnosis present

## 2023-11-07 DIAGNOSIS — E162 Hypoglycemia, unspecified: Secondary | ICD-10-CM | POA: Diagnosis not present

## 2023-11-07 DIAGNOSIS — J4541 Moderate persistent asthma with (acute) exacerbation: Secondary | ICD-10-CM | POA: Diagnosis present

## 2023-11-07 DIAGNOSIS — G253 Myoclonus: Secondary | ICD-10-CM | POA: Diagnosis present

## 2023-11-07 DIAGNOSIS — K7200 Acute and subacute hepatic failure without coma: Secondary | ICD-10-CM | POA: Diagnosis present

## 2023-11-07 DIAGNOSIS — Z79899 Other long term (current) drug therapy: Secondary | ICD-10-CM

## 2023-11-07 DIAGNOSIS — N179 Acute kidney failure, unspecified: Secondary | ICD-10-CM | POA: Diagnosis present

## 2023-11-07 DIAGNOSIS — F1410 Cocaine abuse, uncomplicated: Secondary | ICD-10-CM | POA: Diagnosis present

## 2023-11-07 DIAGNOSIS — Z7951 Long term (current) use of inhaled steroids: Secondary | ICD-10-CM

## 2023-11-07 DIAGNOSIS — D631 Anemia in chronic kidney disease: Secondary | ICD-10-CM | POA: Diagnosis present

## 2023-11-07 DIAGNOSIS — I469 Cardiac arrest, cause unspecified: Principal | ICD-10-CM | POA: Diagnosis present

## 2023-11-07 DIAGNOSIS — J9601 Acute respiratory failure with hypoxia: Secondary | ICD-10-CM | POA: Diagnosis present

## 2023-11-07 DIAGNOSIS — E874 Mixed disorder of acid-base balance: Secondary | ICD-10-CM | POA: Diagnosis present

## 2023-11-07 DIAGNOSIS — J4540 Moderate persistent asthma, uncomplicated: Secondary | ICD-10-CM | POA: Diagnosis not present

## 2023-11-07 DIAGNOSIS — Z87891 Personal history of nicotine dependence: Secondary | ICD-10-CM

## 2023-11-07 DIAGNOSIS — R680 Hypothermia, not associated with low environmental temperature: Secondary | ICD-10-CM | POA: Diagnosis present

## 2023-11-07 LAB — CBC WITH DIFFERENTIAL/PLATELET
Abs Immature Granulocytes: 0.45 10*3/uL — ABNORMAL HIGH (ref 0.00–0.07)
Basophils Absolute: 0 10*3/uL (ref 0.0–0.1)
Basophils Relative: 0 %
Eosinophils Absolute: 0.2 10*3/uL (ref 0.0–0.5)
Eosinophils Relative: 2 %
HCT: 38.1 % — ABNORMAL LOW (ref 39.0–52.0)
Hemoglobin: 11.2 g/dL — ABNORMAL LOW (ref 13.0–17.0)
Immature Granulocytes: 5 %
Lymphocytes Relative: 53 %
Lymphs Abs: 4.8 10*3/uL — ABNORMAL HIGH (ref 0.7–4.0)
MCH: 30.3 pg (ref 26.0–34.0)
MCHC: 29.4 g/dL — ABNORMAL LOW (ref 30.0–36.0)
MCV: 103 fL — ABNORMAL HIGH (ref 80.0–100.0)
Monocytes Absolute: 0.5 10*3/uL (ref 0.1–1.0)
Monocytes Relative: 5 %
Neutro Abs: 3.2 10*3/uL (ref 1.7–7.7)
Neutrophils Relative %: 35 %
Platelets: 217 10*3/uL (ref 150–400)
RBC: 3.7 MIL/uL — ABNORMAL LOW (ref 4.22–5.81)
RDW: 15.3 % (ref 11.5–15.5)
WBC: 9 10*3/uL (ref 4.0–10.5)
nRBC: 0 % (ref 0.0–0.2)

## 2023-11-07 LAB — POCT I-STAT 7, (LYTES, BLD GAS, ICA,H+H)
Acid-base deficit: 5 mmol/L — ABNORMAL HIGH (ref 0.0–2.0)
Bicarbonate: 18.9 mmol/L — ABNORMAL LOW (ref 20.0–28.0)
Calcium, Ion: 1.37 mmol/L (ref 1.15–1.40)
HCT: 31 % — ABNORMAL LOW (ref 39.0–52.0)
Hemoglobin: 10.5 g/dL — ABNORMAL LOW (ref 13.0–17.0)
O2 Saturation: 100 %
Patient temperature: 34.8
Potassium: 5.1 mmol/L (ref 3.5–5.1)
Sodium: 137 mmol/L (ref 135–145)
TCO2: 20 mmol/L — ABNORMAL LOW (ref 22–32)
pCO2 arterial: 28.3 mm[Hg] — ABNORMAL LOW (ref 32–48)
pH, Arterial: 7.422 (ref 7.35–7.45)
pO2, Arterial: 327 mm[Hg] — ABNORMAL HIGH (ref 83–108)

## 2023-11-07 LAB — COMPREHENSIVE METABOLIC PANEL
ALT: 206 U/L — ABNORMAL HIGH (ref 0–44)
AST: 216 U/L — ABNORMAL HIGH (ref 15–41)
Albumin: 2.8 g/dL — ABNORMAL LOW (ref 3.5–5.0)
Alkaline Phosphatase: 86 U/L (ref 38–126)
Anion gap: 13 (ref 5–15)
BUN: 81 mg/dL — ABNORMAL HIGH (ref 8–23)
CO2: 16 mmol/L — ABNORMAL LOW (ref 22–32)
Calcium: >15 mg/dL (ref 8.9–10.3)
Chloride: 106 mmol/L (ref 98–111)
Creatinine, Ser: 5.19 mg/dL — ABNORMAL HIGH (ref 0.61–1.24)
GFR, Estimated: 11 mL/min — ABNORMAL LOW (ref >60)
Glucose, Bld: 144 mg/dL — ABNORMAL HIGH (ref 70–99)
Potassium: >7.5 mmol/L (ref 3.5–5.1)
Sodium: 135 mmol/L (ref 135–145)
Total Bilirubin: 0.6 mg/dL (ref 0.0–1.2)
Total Protein: 5.8 g/dL — ABNORMAL LOW (ref 6.5–8.1)

## 2023-11-07 LAB — TROPONIN I (HIGH SENSITIVITY)
Troponin I (High Sensitivity): 32 ng/L — ABNORMAL HIGH (ref <18)
Troponin I (High Sensitivity): 131 ng/L (ref <18)

## 2023-11-07 LAB — I-STAT CHEM 8, ED
BUN: 95 mg/dL — ABNORMAL HIGH (ref 8–23)
Calcium, Ion: 1.19 mmol/L (ref 1.15–1.40)
Chloride: 110 mmol/L (ref 98–111)
Creatinine, Ser: 4.7 mg/dL — ABNORMAL HIGH (ref 0.61–1.24)
Glucose, Bld: 111 mg/dL — ABNORMAL HIGH (ref 70–99)
HCT: 38 % — ABNORMAL LOW (ref 39.0–52.0)
Hemoglobin: 12.9 g/dL — ABNORMAL LOW (ref 13.0–17.0)
Potassium: 5.6 mmol/L — ABNORMAL HIGH (ref 3.5–5.1)
Sodium: 138 mmol/L (ref 135–145)
TCO2: 19 mmol/L — ABNORMAL LOW (ref 22–32)

## 2023-11-07 LAB — BRAIN NATRIURETIC PEPTIDE: B Natriuretic Peptide: 324.2 pg/mL — ABNORMAL HIGH (ref 0.0–100.0)

## 2023-11-07 LAB — I-STAT ARTERIAL BLOOD GAS, ED
Acid-base deficit: 14 mmol/L — ABNORMAL HIGH (ref 0.0–2.0)
Bicarbonate: 18.6 mmol/L — ABNORMAL LOW (ref 20.0–28.0)
Calcium, Ion: 2.15 mmol/L (ref 1.15–1.40)
HCT: 32 % — ABNORMAL LOW (ref 39.0–52.0)
Hemoglobin: 10.9 g/dL — ABNORMAL LOW (ref 13.0–17.0)
O2 Saturation: 99 %
Patient temperature: 98.6
Potassium: 8 mmol/L (ref 3.5–5.1)
Sodium: 133 mmol/L — ABNORMAL LOW (ref 135–145)
TCO2: 21 mmol/L — ABNORMAL LOW (ref 22–32)
pCO2 arterial: 83.5 mm[Hg] (ref 32–48)
pH, Arterial: 6.955 — CL (ref 7.35–7.45)
pO2, Arterial: 240 mm[Hg] — ABNORMAL HIGH (ref 83–108)

## 2023-11-07 LAB — I-STAT VENOUS BLOOD GAS, ED
Acid-base deficit: 16 mmol/L — ABNORMAL HIGH (ref 0.0–2.0)
Bicarbonate: 17.1 mmol/L — ABNORMAL LOW (ref 20.0–28.0)
Calcium, Ion: 1.2 mmol/L (ref 1.15–1.40)
HCT: 37 % — ABNORMAL LOW (ref 39.0–52.0)
Hemoglobin: 12.6 g/dL — ABNORMAL LOW (ref 13.0–17.0)
O2 Saturation: 73 %
Potassium: 5.6 mmol/L — ABNORMAL HIGH (ref 3.5–5.1)
Sodium: 138 mmol/L (ref 135–145)
TCO2: 19 mmol/L — ABNORMAL LOW (ref 22–32)
pCO2, Ven: 75 mm[Hg] (ref 44–60)
pH, Ven: 6.965 — CL (ref 7.25–7.43)
pO2, Ven: 61 mm[Hg] — ABNORMAL HIGH (ref 32–45)

## 2023-11-07 LAB — I-STAT CG4 LACTIC ACID, ED
Lactic Acid, Venous: 6.6 mmol/L (ref 0.5–1.9)
Lactic Acid, Venous: 2.8 mmol/L (ref 0.5–1.9)

## 2023-11-07 LAB — GLUCOSE, CAPILLARY
Glucose-Capillary: 11 mg/dL — CL (ref 70–99)
Glucose-Capillary: 37 mg/dL — CL (ref 70–99)
Glucose-Capillary: 106 mg/dL — ABNORMAL HIGH (ref 70–99)

## 2023-11-07 LAB — HEMOGLOBIN A1C
Hgb A1c MFr Bld: 5.2 % (ref 4.8–5.6)
Mean Plasma Glucose: 102.54 mg/dL

## 2023-11-07 LAB — ETHANOL: Alcohol, Ethyl (B): <10 mg/dL (ref <10)

## 2023-11-07 MED ORDER — BUSPIRONE HCL 10 MG PO TABS: 30.0000 mg | ORAL_TABLET | Freq: Three times a day (TID) | ORAL | Status: AC | PRN

## 2023-11-07 MED ORDER — LACTATED RINGERS IV BOLUS
1000.0000 mL | Freq: Once | INTRAVENOUS | Status: AC
  Administered 2023-11-07: 1000 mL via INTRAVENOUS

## 2023-11-07 MED ORDER — POLYETHYLENE GLYCOL 3350 17 G PO PACK
17.0000 g | PACK | Freq: Every day | ORAL | Status: DC
  Administered 2023-11-07 – 2023-11-08 (×2): 17 g
  Filled 2023-11-07 (×2): qty 1

## 2023-11-07 MED ORDER — ACETAMINOPHEN 325 MG PO TABS: 650.0000 mg | ORAL_TABLET | ORAL | Status: AC

## 2023-11-07 MED ORDER — LACTATED RINGERS IV BOLUS
1000.0000 mL | INTRAVENOUS | Status: AC
  Administered 2023-11-07 (×2): 1000 mL via INTRAVENOUS

## 2023-11-07 MED ORDER — HEPARIN SODIUM (PORCINE) 5000 UNIT/ML IJ SOLN
5000.0000 [IU] | Freq: Three times a day (TID) | INTRAMUSCULAR | Status: DC
  Administered 2023-11-07 – 2023-11-09 (×6): 5000 [IU] via SUBCUTANEOUS
  Filled 2023-11-07 (×6): qty 1

## 2023-11-07 MED ORDER — PROPOFOL 1000 MG/100ML IV EMUL
5.0000 ug/kg/min | INTRAVENOUS | Status: DC
  Administered 2023-11-07: 5 ug/kg/min via INTRAVENOUS

## 2023-11-07 MED ORDER — ACETAMINOPHEN 325 MG PO TABS: 650.0000 mg | ORAL_TABLET | ORAL | Status: DC | PRN

## 2023-11-07 MED ORDER — DEXTROSE 10 % IV SOLN: INTRAVENOUS | Status: AC

## 2023-11-07 MED ORDER — MAGNESIUM SULFATE 2 GM/50ML IV SOLN: 2.0000 g | Freq: Once | INTRAVENOUS | Status: AC | PRN

## 2023-11-07 MED ORDER — EPINEPHRINE HCL 5 MG/250ML IV SOLN IN NS: 0.5000 ug/min | INTRAVENOUS | Status: DC

## 2023-11-07 MED ORDER — ATROPINE SULFATE 1 MG/10ML IJ SOSY
PREFILLED_SYRINGE | INTRAMUSCULAR | Status: AC | PRN
  Administered 2023-11-07: 1 mg via INTRAVENOUS

## 2023-11-07 MED ORDER — DOCUSATE SODIUM 50 MG/5ML PO LIQD
100.0000 mg | Freq: Two times a day (BID) | ORAL | Status: DC
  Administered 2023-11-07 – 2023-11-08 (×3): 100 mg
  Filled 2023-11-07 (×3): qty 10

## 2023-11-07 MED ORDER — PROPOFOL 1000 MG/100ML IV EMUL
5.0000 ug/kg/min | INTRAVENOUS | Status: DC
  Administered 2023-11-07: 10 ug/kg/min via INTRAVENOUS

## 2023-11-07 MED ORDER — REVEFENACIN 175 MCG/3ML IN SOLN
175.0000 ug | Freq: Every day | RESPIRATORY_TRACT | Status: DC
Start: 2023-11-08 — End: 2023-11-09
  Administered 2023-11-08 – 2023-11-09 (×2): 175 ug via RESPIRATORY_TRACT
  Filled 2023-11-07 (×2): qty 3

## 2023-11-07 MED ORDER — FAMOTIDINE 20 MG PO TABS: 20.0000 mg | ORAL_TABLET | Freq: Two times a day (BID) | ORAL | Status: DC

## 2023-11-07 MED ORDER — ACETAMINOPHEN 160 MG/5ML PO SOLN: 650.0000 mg | ORAL | Status: DC | PRN

## 2023-11-07 MED ORDER — ARFORMOTEROL TARTRATE 15 MCG/2ML IN NEBU
15.0000 ug | INHALATION_SOLUTION | Freq: Two times a day (BID) | RESPIRATORY_TRACT | Status: DC
  Administered 2023-11-07 – 2023-11-09 (×4): 15 ug via RESPIRATORY_TRACT
  Filled 2023-11-07 (×4): qty 2

## 2023-11-07 MED ORDER — PROSOURCE TF20 ENFIT COMPATIBL EN LIQD
60.0000 mL | Freq: Every day | ENTERAL | Status: DC
  Administered 2023-11-07 – 2023-11-09 (×3): 60 mL
  Filled 2023-11-07 (×3): qty 60

## 2023-11-07 MED ORDER — INSULIN ASPART 100 UNIT/ML IV SOLN
5.0000 [IU] | Freq: Once | INTRAVENOUS | Status: AC
  Administered 2023-11-07: 5 [IU] via INTRAVENOUS

## 2023-11-07 MED ORDER — ONDANSETRON HCL 4 MG/2ML IJ SOLN: 4.0000 mg | Freq: Four times a day (QID) | INTRAMUSCULAR | Status: DC | PRN

## 2023-11-07 MED ORDER — DEXTROSE 50 % IV SOLN
INTRAVENOUS | Status: AC
  Administered 2023-11-07: 50 mL via INTRAVENOUS
  Filled 2023-11-07: qty 50

## 2023-11-07 MED ORDER — HEPARIN SODIUM (PORCINE) 5000 UNIT/ML IJ SOLN: 5000.0000 [IU] | Freq: Three times a day (TID) | INTRAMUSCULAR | Status: DC

## 2023-11-07 MED ORDER — ACETAMINOPHEN 650 MG RE SUPP: 650.0000 mg | RECTAL | Status: AC

## 2023-11-07 MED ORDER — FAMOTIDINE 20 MG PO TABS
20.0000 mg | ORAL_TABLET | Freq: Two times a day (BID) | ORAL | Status: DC
  Administered 2023-11-07 – 2023-11-08 (×3): 20 mg
  Filled 2023-11-07 (×3): qty 1

## 2023-11-07 MED ORDER — DEXTROSE 50 % IV SOLN: 50.0000 mL | Freq: Once | INTRAVENOUS | Status: AC

## 2023-11-07 MED ORDER — EPINEPHRINE 1 MG/10ML IJ SOSY
PREFILLED_SYRINGE | INTRAMUSCULAR | Status: AC | PRN
  Administered 2023-11-07: 1 mg via INTRAVENOUS

## 2023-11-07 MED ORDER — SODIUM CHLORIDE 0.9 % IV SOLN
2.0000 g | INTRAVENOUS | Status: DC
Start: 2023-11-07 — End: 2023-11-09
  Administered 2023-11-08 (×2): 2 g via INTRAVENOUS
  Filled 2023-11-07 (×2): qty 20

## 2023-11-07 MED ORDER — ORAL CARE MOUTH RINSE: 15.0000 mL | OROMUCOSAL | Status: DC | PRN

## 2023-11-07 MED ORDER — FENTANYL CITRATE PF 50 MCG/ML IJ SOSY
50.0000 ug | PREFILLED_SYRINGE | Freq: Once | INTRAMUSCULAR | Status: DC
Start: 2023-11-07 — End: 2023-11-09

## 2023-11-07 MED ORDER — CHLORHEXIDINE GLUCONATE CLOTH 2 % EX PADS
6.0000 | MEDICATED_PAD | Freq: Every day | CUTANEOUS | Status: DC
  Administered 2023-11-07 – 2023-11-09 (×3): 6 via TOPICAL

## 2023-11-07 MED ORDER — SODIUM BICARBONATE 8.4 % IV SOLN
INTRAVENOUS | Status: AC | PRN
  Administered 2023-11-07: 50 meq via INTRAVENOUS

## 2023-11-07 MED ORDER — ACETAMINOPHEN 160 MG/5ML PO SOLN
650.0000 mg | ORAL | Status: AC
  Administered 2023-11-07 – 2023-11-09 (×10): 650 mg
  Filled 2023-11-07 (×10): qty 20.3

## 2023-11-07 MED ORDER — DEXTROSE 50 % IV SOLN
1.0000 | Freq: Once | INTRAVENOUS | Status: AC
  Administered 2023-11-07: 50 mL via INTRAVENOUS

## 2023-11-07 MED ORDER — INSULIN ASPART 100 UNIT/ML IJ SOLN: 0.0000 [IU] | INTRAMUSCULAR | Status: DC

## 2023-11-07 MED ORDER — PROPOFOL 1000 MG/100ML IV EMUL
0.0000 ug/kg/min | INTRAVENOUS | Status: DC
  Administered 2023-11-08: 40 ug/kg/min via INTRAVENOUS
  Administered 2023-11-08 (×2): 50 ug/kg/min via INTRAVENOUS
  Administered 2023-11-08: 60 ug/kg/min via INTRAVENOUS
  Administered 2023-11-08 (×2): 50 ug/kg/min via INTRAVENOUS
  Administered 2023-11-08: 20 ug/kg/min via INTRAVENOUS
  Administered 2023-11-09 (×5): 50 ug/kg/min via INTRAVENOUS
  Administered 2023-11-09: 80 ug/kg/min via INTRAVENOUS
  Filled 2023-11-07 (×12): qty 100

## 2023-11-07 MED ORDER — ORAL CARE MOUTH RINSE
15.0000 mL | OROMUCOSAL | Status: DC
  Administered 2023-11-08 – 2023-11-09 (×22): 15 mL via OROMUCOSAL

## 2023-11-07 MED ORDER — NOREPINEPHRINE 4 MG/250ML-% IV SOLN: 2.0000 ug/min | INTRAVENOUS | Status: DC

## 2023-11-07 MED ORDER — BUDESONIDE 0.25 MG/2ML IN SUSP
0.2500 mg | Freq: Two times a day (BID) | RESPIRATORY_TRACT | Status: DC
  Administered 2023-11-07 – 2023-11-09 (×4): 0.25 mg via RESPIRATORY_TRACT
  Filled 2023-11-07 (×4): qty 2

## 2023-11-07 MED ORDER — POLYETHYLENE GLYCOL 3350 17 G PO PACK: 17.0000 g | PACK | Freq: Every day | ORAL | Status: DC | PRN

## 2023-11-07 MED ORDER — DOCUSATE SODIUM 50 MG/5ML PO LIQD: 100.0000 mg | Freq: Two times a day (BID) | ORAL | Status: DC | PRN

## 2023-11-07 MED ORDER — IPRATROPIUM-ALBUTEROL 0.5-2.5 (3) MG/3ML IN SOLN
3.0000 mL | RESPIRATORY_TRACT | Status: DC
  Administered 2023-11-07 – 2023-11-09 (×11): 3 mL via RESPIRATORY_TRACT
  Filled 2023-11-07 (×10): qty 3

## 2023-11-07 MED ORDER — ACETAMINOPHEN 650 MG RE SUPP: 650.0000 mg | RECTAL | Status: DC | PRN

## 2023-11-07 MED ORDER — PIVOT 1.5 CAL PO LIQD
1000.0000 mL | ORAL | Status: DC
  Administered 2023-11-08: 1000 mL

## 2023-11-07 MED ORDER — FENTANYL BOLUS VIA INFUSION
50.0000 ug | INTRAVENOUS | Status: DC | PRN
  Administered 2023-11-09: 100 ug via INTRAVENOUS

## 2023-11-07 MED ORDER — FENTANYL 2500MCG IN NS 250ML (10MCG/ML) PREMIX INFUSION
50.0000 ug/h | INTRAVENOUS | Status: DC
  Administered 2023-11-07: 50 ug/h via INTRAVENOUS
  Filled 2023-11-07: qty 250

## 2023-11-07 MED ORDER — PROPOFOL 1000 MG/100ML IV EMUL
INTRAVENOUS | Status: AC
  Administered 2023-11-07: 5 ug/kg/min via INTRAVENOUS
  Filled 2023-11-07: qty 100

## 2023-11-07 MED ORDER — CALCIUM CHLORIDE 10 % IV SOLN
INTRAVENOUS | Status: AC | PRN
  Administered 2023-11-07: 1 g via INTRAVENOUS

## 2023-11-07 NOTE — H&P (Signed)
NAME:  Paul Donaldson, MRN:  [id number], DOB:  1951-05-05, LOS: 0 ADMISSION DATE:  11/07/2023, CONSULTATION DATE:  2/3 REFERRING MD:  [facility]-ED, CHIEF COMPLAINT:  post-arrest   History of Present Illness:  Paul Donaldson is a 73 y/o gentleman with a history of cocaine abuse who presented after being found down in a parking lot. He had 50 minutes of CPR and ROSC was achieved around the time he arrived to the emergency department.  Rhythm was PEA.Marland Kitchen He was seen in the ED 2 days ago for an acute asthma exacerbation and was discharged with plans for outpatient follow-up with albuterol, prednisone, Symbicort.  In the emergency department he was started on mechanical ventilation and arterial line was placed.  He has not required vasopressors.  No family at bedside.  Pertinent  Medical History  Cocaine use HTN Asthma Former tobacco abuse  Significant Hospital Events: Including procedures, antibiotic start and stop dates in addition to other pertinent events   2/3 admitted, not on pressors. Aline placed in ED.  Interim History / Subjective:    Objective   Blood pressure 99/61, pulse (!) 33, resp. rate 15, height 5\' 10"  (1.778 m), SpO2 100%.    Vent Mode: PRVC FiO2 (%):  [100 %] 100 % Set Rate:  [28 bmp] 28 bmp Vt Set:  [580 mL] 580 mL PEEP:  [5 cmH20] 5 cmH20 Plateau Pressure:  [16 cmH20] 16 cmH20  No intake or output data in the 24 hours ending 11/07/23 2101 There were no vitals filed for this visit.  Examination: General: critically ill appearing man lying in bed intubated, not sedated HENT:  Paul Donaldson, eyes anicteric. ETT in place.  Lungs: Obstructed on vent, faint wheezing bilaterally.  Synchronous with mechanical ventilation. Cardiovascular: S1-S2, regular rate and rhythm Abdomen: Soft, nontender Extremities: No edema, no wounds.  No cyanosis. Neuro: RASS -5, + cough, pinpoint but reactive pupils.  Not breathing over the vent. GU: Foley  CXR personally reviewed-no infiltrates.   Endotracheal tube 5 cm above the carina.  6.96/84/240/19 Potassium greater than 7.5 Bicarb 16 Calcium >15 BUN 81 Creatinine 5.19 AST 216 ALT 206 LA 6.6 BNP 324 Troponin 32 WBC 9 H/H11.2/38.1 Platelets 217 UDS not yet collected   Resolved Hospital Problem list     Assessment & Plan:   Cardiac arrest- PEA. Not sure initial cause, concern for drug-related cause given his history. Asthma is possible, but currently dose not seem severe enough to be the most likely cuase. -TTM -serial troponins -echo -UDS not yet collected -Supportive care  Acute respiratory failure with hypoxia and hypercapnia Acute on chronic moderate persistent asthma. -LTVV - VAP prevention protocol - PAD protocol for sedation - Vent adjustments, respiratory rate reduced due to air trapping and wheezing.  Repeat ABG in 1 hour. 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 Sister Victorino Dike updated via phone.  Endotracheal tube VAP 5 cm above the carina.  Best Practice (right click and "Reselect all SmartList Selections" daily)   Diet/type: tubefeeds DVT  prophylaxis prophylactic heparin  Pressure ulcer(s): N/A GI prophylaxis: H2B Lines: Arterial Line Foley:  Yes, and it is still needed Code Status:  full code Last date of multidisciplinary goals of care discussion [sister updated 2/3]  Labs   CBC: Recent Labs  Lab 11/07/23 1918 11/07/23 1926 11/07/23 1930  WBC  --  9.0  --   NEUTROABS  --  3.2  --   HGB 12.6*  12.9* 11.2* 10.9*  HCT 37.0*  38.0* 38.1* 32.0*  MCV  --  103.0*  --   PLT  --  217  --     Basic Metabolic Panel: Recent Labs  Lab 11/07/23 1918 11/07/23 1930  NA 138  138 133*  K 5.6*  5.6* 8.0*  CL 110  --   GLUCOSE 111*  --   BUN 95*  --   CREATININE 4.70*  --    GFR: Estimated Creatinine Clearance: 14.2 mL/min (A) (by C-G formula based on SCr of 4.7 mg/dL (H)). Recent Labs  Lab 11/07/23 1919 11/07/23 1926  WBC  --  9.0  LATICACIDVEN 6.6*  --     Liver Function Tests: No results for input(s): "AST", "ALT", "ALKPHOS", "BILITOT", "PROT", "ALBUMIN" in the last 168 hours. No results for input(s): "LIPASE", "AMYLASE" in the last 168 hours. No results for input(s): "AMMONIA" in the last 168 hours.  ABG    Component Value Date/Time   PHART 6.955 (LL) 11/07/2023 1930   PCO2ART 83.5 (HH) 11/07/2023 1930   PO2ART 240 (H) 11/07/2023 1930   HCO3 18.6 (L) 11/07/2023 1930   TCO2 21 (L) 11/07/2023 1930   ACIDBASEDEF 14.0 (H) 11/07/2023 1930   O2SAT 99 11/07/2023 1930     Coagulation Profile: No results for input(s): "INR", "PROTIME" in the last 168 hours.  Cardiac Enzymes: No results for input(s): "CKTOTAL", "CKMB", "CKMBINDEX", "TROPONINI" in the last 168 hours.  HbA1C: Hgb A1c MFr Bld  Date/Time Value Ref Range Status  02/08/2023 02:55 PM 5.1 4.8 - 5.6 % Final    Comment:    (NOTE) Pre diabetes:          5.7%-6.4%  Diabetes:              >6.4%  Glycemic control for   <7.0% adults with diabetes     CBG: No results for input(s): "GLUCAP" in the last 168 hours.  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   Surgical History:   Past Surgical History:  Procedure Laterality Date   COLONOSCOPY W/ BIOPSIES  2015   TUBULAR ADENOMA (X1).   CYST EXCISION  1969   back     Social History:   reports that he quit smoking about 27 years ago. His smoking use included cigarettes. He has never used smokeless tobacco. He reports that he does not drink alcohol and does not use drugs.   Family History:  His family history is negative for Colon cancer, Esophageal cancer, Rectal cancer, and Stomach cancer.   Allergies No Known Allergies   Home Medications  Prior to Admission medications   Medication Sig Start Date  End Date Taking? Authorizing Provider  amLODipine (NORVASC) 10 MG tablet Take 1 tablet (10 mg total) by mouth daily. 02/09/23 03/11/23  Lauree Chandler, NP  atorvastatin (LIPITOR) 10 MG tablet Take 1 tablet (10 mg total) by mouth daily. Patient not taking: Reported on 08/23/2023 02/09/23   Lauree Chandler, NP  budesonide-formoterol [facility]) 80-4.5 MCG/ACT inhaler Inhale 2 puffs into the lungs in the morning and at bedtime. 11/05/23   Kommor, Madison, MD  carvedilol (COREG) 12.5 MG tablet Take 12.5 mg by mouth 2 (two) times daily with a meal.    [provider]  fluticasone furoate-vilanterol (BREO ELLIPTA) 200-25 MCG/ACT AEPB Inhale 1 puff into the lungs daily. 02/10/23   Lauree Chandler, NP  predniSONE (DELTASONE) 10 MG tablet Take 4 tablets (40 mg total) by mouth daily for 4 days. 11/05/23 11/18/2023  Kommor, Wyn Forster, MD     Critical care time: 55 min.     Steffanie Dunn, DO 11/07/23 9:35 PM [facility]  For contact information, see Amion. If no response to pager, please call PCCM consult pager. After hours, 7PM- 7AM,  please call Elink.

## 2023-11-07 NOTE — Procedures (Signed)
Arterial Line Insertion Start/End2/12/2023 7:54 PM, 11/07/2023 7:54 PM  Patient location: ED. Preanesthetic checklist: patient identified, site marked and monitors and equipment checked Right, radial was placed Catheter size: 20 G Hand hygiene performed  and maximum sterile barriers used  Allen's test indicative of satisfactory collateral circulation Attempts: 1 Procedure performed without using ultrasound guided technique. Following insertion, dressing applied. Post procedure assessment: normal  Patient tolerated the procedure well with no immediate complications.  Alarms Checked

## 2023-11-07 NOTE — ED Provider Notes (Signed)
[place] EMERGENCY DEPARTMENT AT [facility] Provider Note   CSN: [id number] Arrival date & time: 11/07/23  4098     History {Add pertinent medical, surgical, social history, OB history to HPI:1} Chief Complaint  Patient presents with   CPR    Paul Donaldson is a 73 y.o. male.  73 year old male with prior medical history as detailed below presents in cardiac arrest patient was found on the ground outside a local store.  Unknown known downtime.  Patient arrives with CPR in progress.  Patient with approximately 50 minutes of CPR prior to arrival.  No shocks administered.  PEA throughout majority of arrest per EMS.  Patient was given 5 epis prior to arrival.    The history is provided by the EMS personnel.       Home Medications Prior to Admission medications   Medication Sig Start Date End Date Taking? Authorizing Provider  amLODipine (NORVASC) 10 MG tablet Take 1 tablet (10 mg total) by mouth daily. 02/09/23 03/11/23  Lauree Chandler, NP  atorvastatin (LIPITOR) 10 MG tablet Take 1 tablet (10 mg total) by mouth daily. Patient not taking: Reported on 08/23/2023 02/09/23   Lauree Chandler, NP  budesonide-formoterol [facility]) 80-4.5 MCG/ACT inhaler Inhale 2 puffs into the lungs in the morning and at bedtime. 11/05/23   Kommor, Madison, MD  carvedilol (COREG) 12.5 MG tablet Take 12.5 mg by mouth 2 (two) times daily with a meal.    [provider]  fluticasone furoate-vilanterol (BREO ELLIPTA) 200-25 MCG/ACT AEPB Inhale 1 puff into the lungs daily. 02/10/23   Lauree Chandler, NP  predniSONE (DELTASONE) 10 MG tablet Take 4 tablets (40 mg total) by mouth daily for 4 days. 11/05/23 11/27/2023  Kommor, Wyn Forster, MD      Allergies    Patient has no known allergies.    Review of Systems   Review of Systems  Unable to perform ROS: Acuity of condition    Physical Exam Updated Vital Signs BP (!) 157/139   Pulse 92   Resp (!) 22   Ht 5\' 10"  (1.778 m)   BMI  27.26 kg/m  Physical Exam Vitals and nursing note reviewed.  Constitutional:      General: He is not in acute distress.    Appearance: He is well-developed.     Comments: In cardiac arrest, unresponsive, CPR in progress, assisted ventilations in progress  HENT:     Head: Normocephalic and atraumatic.  Eyes:     Comments: Pupils fixed and dilated bilaterally  Cardiovascular:     Comments: PEA on monitor.  CPR in progress Pulmonary:     Effort: No respiratory distress.     Comments: Assisted ventilations in progress.  Breath sounds clear with ventilations bilaterally. Abdominal:     General: There is no distension.     Palpations: Abdomen is soft.     Tenderness: There is no abdominal tenderness.  Musculoskeletal:        General: No deformity.  Skin:    General: Skin is warm and dry.  Neurological:     Comments: Unresponsive.  No spontaneous respirations.  No spontaneous movement.     ED Results / Procedures / Treatments   Labs (all labs ordered are listed, but only abnormal results are displayed) Labs Reviewed  I-STAT CHEM 8, ED - Abnormal; Notable for the following components:      Result Value   Potassium 5.6 (*)    BUN 95 (*)    Creatinine,  Ser 4.70 (*)    Glucose, Bld 111 (*)    TCO2 19 (*)    Hemoglobin 12.9 (*)    HCT 38.0 (*)    All other components within normal limits  I-STAT VENOUS BLOOD GAS, ED - Abnormal; Notable for the following components:   pH, Ven 6.965 (*)    pCO2, Ven 75.0 (*)    pO2, Ven 61 (*)    Bicarbonate 17.1 (*)    TCO2 19 (*)    Acid-base deficit 16.0 (*)    Potassium 5.6 (*)    HCT 37.0 (*)    Hemoglobin 12.6 (*)    All other components within normal limits  I-STAT CG4 LACTIC ACID, ED - Abnormal; Notable for the following components:   Lactic Acid, Venous 6.6 (*)    All other components within normal limits  CBC WITH DIFFERENTIAL/PLATELET  ETHANOL  COMPREHENSIVE METABOLIC PANEL  BRAIN NATRIURETIC PEPTIDE  URINALYSIS, W/ REFLEX  TO CULTURE (INFECTION SUSPECTED)  RAPID URINE DRUG SCREEN, HOSP PERFORMED  I-STAT ARTERIAL BLOOD GAS, ED  TROPONIN I (HIGH SENSITIVITY)    EKG None  Radiology DG Chest Portable 1 View Result Date: 11/05/2023 CLINICAL DATA:  Dyspnea EXAM: PORTABLE CHEST 1 VIEW COMPARISON:  02/28/2023 FINDINGS: Heart size and mediastinal contours are unremarkable. There is no pleural fluid, interstitial edema, or airspace disease. Visualized osseous structures appear intact. IMPRESSION: No active disease. Electronically Signed   By: Signa Kell M.D.   On: 11/05/2023 20:07    Procedures Procedures  {Document cardiac monitor, telemetry assessment procedure when appropriate:1}  Medications Ordered in ED Medications  EPINEPHrine (ADRENALIN) 1 MG/10ML injection (1 mg Intravenous Given 11/07/23 1908)  sodium bicarbonate injection (50 mEq Intravenous Given 11/07/23 1919)  atropine 1 MG/10ML injection (1 mg Intravenous Given 11/07/23 1919)  calcium chloride injection (1 g Intravenous Given 11/07/23 1922)  propofol (DIPRIVAN) 1000 MG/100ML infusion (has no administration in time range)  lactated ringers bolus 1,000 mL (1,000 mLs Intravenous New Bag/Given 11/07/23 1937)    ED Course/ Medical Decision Making/ A&P   {   Click here for ABCD2, HEART and other calculatorsREFRESH Note before signing :1}                              Medical Decision Making Amount and/or Complexity of Data Reviewed Labs: ordered. Radiology: ordered.  Risk OTC drugs. Prescription drug management.    Medical Screen Complete  This patient presented to the ED with complaint of ***.  This complaint involves an extensive number of treatment options. The initial differential diagnosis includes, but is not limited to, ***  This presentation is: {IllnessRisk:19196::"***","Acute","Chronic","Self-Limited","Previously Undiagnosed","Uncertain Prognosis","Complicated","Systemic Symptoms","Threat to Life/Bodily Function"}    Co  morbidities that complicated the patient's evaluation  ***   Additional history obtained:  Additional history obtained from {History source:19196::"EMS","Spouse","Family","Friend","Caregiver"} External records from outside sources obtained and reviewed including prior ED visits and prior Inpatient records.    Lab Tests:  I ordered and personally interpreted labs.  The pertinent results include:  ***   Imaging Studies ordered:  I ordered imaging studies including ***  I independently visualized and interpreted obtained imaging which showed *** I agree with the radiologist interpretation.   Cardiac Monitoring:  The patient was maintained on a cardiac monitor.  I personally viewed and interpreted the cardiac monitor which showed an underlying rhythm of: ***   Medicines ordered:  I ordered medication including ***  for ***  Reevaluation of  the patient after these medicines showed that the patient: {resolved/improved/worsened:23923::"improved"}    Test Considered:  ***   Critical Interventions:  ***   Consultations Obtained:  I consulted ***,  and discussed lab and imaging findings as well as pertinent plan of care.    Problem List / ED Course:  ***   Reevaluation:  After the interventions noted above, I reevaluated the patient and found that they have: {resolved/improved/worsened:23923::"improved"}   Social Determinants of Health:  ***   Disposition:  After consideration of the diagnostic results and the patients response to treatment, I feel that the patent would benefit from ***.    {Document critical care time when appropriate:1} {Document review of labs and clinical decision tools ie heart score, Chads2Vasc2 etc:1}  {Document your independent review of radiology images, and any outside records:1} {Document your discussion with family members, caretakers, and with consultants:1} {Document social determinants of health affecting pt's  care:1} {Document your decision making why or why not admission, treatments were needed:1} Final Clinical Impression(s) / ED Diagnoses Final diagnoses:  None    Rx / DC Orders ED Discharge Orders     None

## 2023-11-07 NOTE — Progress Notes (Signed)
eLink Physician-Brief Progress Note Patient Name: Paul Donaldson DOB: 09-24-1951 MRN: [id number]   Date of Service  11/07/2023  HPI/Events of Note  73 year old male with a history of cocaine abuse that had prolonged out-of-hospital cardiac arrest with PEA after recent asthma exacerbation.   Patient is hypothermic, tachypneic, bradycardic and currently on a fentanyl and propofol infusion.  Saturating 100% on 100% FiO2.  Last set of labs consistent and severe metabolic and respiratory acidosis.  Multiple metabolic abnormalities on last BMP with severe hyperkalemia, elevated creatinine, hypercalcemia, transaminitis, and lactic acidosis.  Macrocytic anemia present.  Repeated plan of care hypoglycemia.  Chest radiograph reviewed.  CT head, cervical spine, and abdominal film pending  eICU Interventions  Empiric ceftriaxone.  Neuromonitoring, maintain mechanical ventilation and daily spontaneous awakening trials.  EEG pending.  Scheduled SVNs in place  Use norepinephrine as needed to maintain MAP greater than 65  Trend electrolytes, liver function, coagulopathy.  Trend lactic acid and point-of-care glucose.   Dextrose infusion for refractory hypoglycemia  DVT prophylaxis with heparin subcutaneous GI prophylaxis with famotidine     Intervention Category Evaluation Type: New Patient Evaluation  Mykenna Viele 11/07/2023, 9:41 PM

## 2023-11-08 ENCOUNTER — Inpatient Hospital Stay (HOSPITAL_COMMUNITY): Payer: Medicare (Managed Care)

## 2023-11-08 DIAGNOSIS — G9340 Encephalopathy, unspecified: Secondary | ICD-10-CM

## 2023-11-08 DIAGNOSIS — J9602 Acute respiratory failure with hypercapnia: Secondary | ICD-10-CM | POA: Diagnosis not present

## 2023-11-08 DIAGNOSIS — E162 Hypoglycemia, unspecified: Secondary | ICD-10-CM

## 2023-11-08 DIAGNOSIS — R569 Unspecified convulsions: Secondary | ICD-10-CM | POA: Diagnosis not present

## 2023-11-08 DIAGNOSIS — I469 Cardiac arrest, cause unspecified: Secondary | ICD-10-CM | POA: Diagnosis not present

## 2023-11-08 DIAGNOSIS — J9601 Acute respiratory failure with hypoxia: Secondary | ICD-10-CM | POA: Diagnosis not present

## 2023-11-08 DIAGNOSIS — N184 Chronic kidney disease, stage 4 (severe): Secondary | ICD-10-CM

## 2023-11-08 LAB — RAPID URINE DRUG SCREEN, HOSP PERFORMED
Amphetamines: NOT DETECTED
Barbiturates: NOT DETECTED
Benzodiazepines: NOT DETECTED
Cocaine: POSITIVE — AB
Opiates: NOT DETECTED
Tetrahydrocannabinol: NOT DETECTED

## 2023-11-08 LAB — TRIGLYCERIDES: Triglycerides: 48 mg/dL (ref <150)

## 2023-11-08 LAB — COMPREHENSIVE METABOLIC PANEL
ALT: 205 U/L — ABNORMAL HIGH (ref 0–44)
AST: 205 U/L — ABNORMAL HIGH (ref 15–41)
Albumin: 2.6 g/dL — ABNORMAL LOW (ref 3.5–5.0)
Alkaline Phosphatase: 75 U/L (ref 38–126)
Anion gap: 8 (ref 5–15)
BUN: 88 mg/dL — ABNORMAL HIGH (ref 8–23)
CO2: 18 mmol/L — ABNORMAL LOW (ref 22–32)
Calcium: 8.9 mg/dL (ref 8.9–10.3)
Chloride: 110 mmol/L (ref 98–111)
Creatinine, Ser: 4.95 mg/dL — ABNORMAL HIGH (ref 0.61–1.24)
GFR, Estimated: 12 mL/min — ABNORMAL LOW (ref >60)
Glucose, Bld: 113 mg/dL — ABNORMAL HIGH (ref 70–99)
Potassium: 5.2 mmol/L — ABNORMAL HIGH (ref 3.5–5.1)
Sodium: 136 mmol/L (ref 135–145)
Total Bilirubin: 0.7 mg/dL (ref 0.0–1.2)
Total Protein: 5.5 g/dL — ABNORMAL LOW (ref 6.5–8.1)

## 2023-11-08 LAB — BASIC METABOLIC PANEL
Anion gap: 14 (ref 5–15)
BUN: 83 mg/dL — ABNORMAL HIGH (ref 8–23)
CO2: 17 mmol/L — ABNORMAL LOW (ref 22–32)
Calcium: 9.4 mg/dL (ref 8.9–10.3)
Chloride: 107 mmol/L (ref 98–111)
Creatinine, Ser: 4.85 mg/dL — ABNORMAL HIGH (ref 0.61–1.24)
GFR, Estimated: 12 mL/min — ABNORMAL LOW (ref >60)
Glucose, Bld: 111 mg/dL — ABNORMAL HIGH (ref 70–99)
Potassium: 4.9 mmol/L (ref 3.5–5.1)
Sodium: 138 mmol/L (ref 135–145)

## 2023-11-08 LAB — URINALYSIS, W/ REFLEX TO CULTURE (INFECTION SUSPECTED)
Bilirubin Urine: NEGATIVE
Glucose, UA: NEGATIVE mg/dL
Ketones, ur: NEGATIVE mg/dL
Nitrite: NEGATIVE
Protein, ur: 30 mg/dL — AB
Specific Gravity, Urine: 1.01 (ref 1.005–1.030)
pH: 5 (ref 5.0–8.0)

## 2023-11-08 LAB — ECHOCARDIOGRAM COMPLETE
Area-P 1/2: 3.46 cm2
Height: 70 in
S' Lateral: 2.7 cm
Weight: 3135.82 [oz_av]

## 2023-11-08 LAB — POCT I-STAT 7, (LYTES, BLD GAS, ICA,H+H)
Acid-base deficit: 7 mmol/L — ABNORMAL HIGH (ref 0.0–2.0)
Bicarbonate: 17.7 mmol/L — ABNORMAL LOW (ref 20.0–28.0)
Calcium, Ion: 1.29 mmol/L (ref 1.15–1.40)
HCT: 34 % — ABNORMAL LOW (ref 39.0–52.0)
Hemoglobin: 11.6 g/dL — ABNORMAL LOW (ref 13.0–17.0)
O2 Saturation: 100 %
Patient temperature: 36.7
Potassium: 5.2 mmol/L — ABNORMAL HIGH (ref 3.5–5.1)
Sodium: 138 mmol/L (ref 135–145)
TCO2: 19 mmol/L — ABNORMAL LOW (ref 22–32)
pCO2 arterial: 31 mm[Hg] — ABNORMAL LOW (ref 32–48)
pH, Arterial: 7.363 (ref 7.35–7.45)
pO2, Arterial: 199 mm[Hg] — ABNORMAL HIGH (ref 83–108)

## 2023-11-08 LAB — PROTIME-INR
INR: 1.2 (ref 0.8–1.2)
Prothrombin Time: 14.9 s (ref 11.4–15.2)

## 2023-11-08 LAB — TROPONIN I (HIGH SENSITIVITY)
Troponin I (High Sensitivity): 302 ng/L (ref <18)
Troponin I (High Sensitivity): 240 ng/L (ref <18)

## 2023-11-08 LAB — GLUCOSE, CAPILLARY
Glucose-Capillary: 110 mg/dL — ABNORMAL HIGH (ref 70–99)
Glucose-Capillary: 100 mg/dL — ABNORMAL HIGH (ref 70–99)
Glucose-Capillary: 95 mg/dL (ref 70–99)
Glucose-Capillary: 79 mg/dL (ref 70–99)
Glucose-Capillary: 99 mg/dL (ref 70–99)

## 2023-11-08 LAB — MRSA NEXT GEN BY PCR, NASAL: MRSA by PCR Next Gen: NOT DETECTED

## 2023-11-08 LAB — CBC
HCT: 34.2 % — ABNORMAL LOW (ref 39.0–52.0)
Hemoglobin: 11.2 g/dL — ABNORMAL LOW (ref 13.0–17.0)
MCH: 30.4 pg (ref 26.0–34.0)
MCHC: 32.7 g/dL (ref 30.0–36.0)
MCV: 92.9 fL (ref 80.0–100.0)
Platelets: 198 10*3/uL (ref 150–400)
RBC: 3.68 MIL/uL — ABNORMAL LOW (ref 4.22–5.81)
RDW: 15.4 % (ref 11.5–15.5)
WBC: 13.5 10*3/uL — ABNORMAL HIGH (ref 4.0–10.5)
nRBC: 0 % (ref 0.0–0.2)

## 2023-11-08 LAB — MAGNESIUM: Magnesium: 2 mg/dL (ref 1.7–2.4)

## 2023-11-08 LAB — PHOSPHORUS: Phosphorus: 3.5 mg/dL (ref 2.5–4.6)

## 2023-11-08 LAB — LACTIC ACID, PLASMA: Lactic Acid, Venous: 0.7 mmol/L (ref 0.5–1.9)

## 2023-11-08 MED ORDER — LEVETIRACETAM IN NACL 500 MG/100ML IV SOLN
500.0000 mg | Freq: Two times a day (BID) | INTRAVENOUS | Status: DC
  Administered 2023-11-08 – 2023-11-09 (×2): 500 mg via INTRAVENOUS
  Filled 2023-11-08 (×2): qty 100

## 2023-11-08 MED ORDER — THIAMINE MONONITRATE 100 MG PO TABS
100.0000 mg | ORAL_TABLET | Freq: Every day | ORAL | Status: DC
  Administered 2023-11-08 – 2023-11-09 (×2): 100 mg
  Filled 2023-11-08 (×2): qty 1

## 2023-11-08 MED ORDER — VITAL 1.5 CAL PO LIQD
1000.0000 mL | ORAL | Status: DC
  Administered 2023-11-08: 1000 mL
  Filled 2023-11-08: qty 1000

## 2023-11-08 MED ORDER — FAMOTIDINE 20 MG PO TABS: 20.0000 mg | ORAL_TABLET | Freq: Every day | ORAL | Status: DC

## 2023-11-08 MED ORDER — SODIUM ZIRCONIUM CYCLOSILICATE 5 G PO PACK
5.0000 g | PACK | Freq: Once | ORAL | Status: AC
  Administered 2023-11-08: 5 g
  Filled 2023-11-08: qty 1

## 2023-11-08 MED ORDER — PERFLUTREN LIPID MICROSPHERE: 1.0000 mL | INTRAVENOUS | Status: AC | PRN

## 2023-11-08 MED ORDER — HYDRALAZINE HCL 20 MG/ML IJ SOLN
10.0000 mg | INTRAMUSCULAR | Status: DC | PRN
  Administered 2023-11-08: 20 mg via INTRAVENOUS
  Administered 2023-11-08: 10 mg via INTRAVENOUS
  Administered 2023-11-09: 20 mg via INTRAVENOUS
  Filled 2023-11-08 (×3): qty 1

## 2023-11-08 MED ORDER — LEVETIRACETAM IN NACL 1000 MG/100ML IV SOLN
1000.0000 mg | Freq: Once | INTRAVENOUS | Status: AC
  Administered 2023-11-08: 1000 mg via INTRAVENOUS
  Filled 2023-11-08: qty 100

## 2023-11-08 NOTE — Progress Notes (Signed)
CSW received consult for primary contact for patient. CSW spoke with patients daughter Paul Donaldson who confirmed she is patients primary point of contact tele# [phone]. All questions answered. No further questions reported at this time.

## 2023-11-08 NOTE — Progress Notes (Addendum)
 Initial Nutrition Assessment  DOCUMENTATION CODES:   Not applicable   INTERVENTION:   Initiate tube feeding via OGT: Vital 1.5 @ 36mL/hr and advance by 69mL/hr Q8 until goal rate of 54mL/hr achieved Pro-Source TF20 60 mL daily  TF to provide 2240kcal, 117g protein, free water  Recommend draw PHOS/Mg to assess for refeeding as recent intake cannot be assessed Thiamine  100mg  x5 days d/t refeeding risk  NUTRITION DIAGNOSIS:  Inadequate oral intake related to inability to eat as evidenced by NPO status.  GOAL:  Patient will meet greater than or equal to 90% of their needs  MONITOR:  Vent status, Diet advancement, Weight trends, TF tolerance, Labs  REASON FOR ASSESSMENT:   Consult, New TF    ASSESSMENT:  Pt is 73 y.o. male w/ hx of cocaine abuse and experienced prolonged out-of hospital cardiac arrest with PEA after recent asthma exacerbation. Required 50 minutes of CPR before ROSC was achieved. PMH: asthma, cocaine abuse, HTN, dyslipidemia.  2/3 admitted cardiac arrest, not on pressors 2/4 EEG  Concern for anoxic brain injury and patient with some myoclonic jerking w/ stimulation and facial twitching. He is unresponsive and sedated.   Patient is currently intubated on ventilator support MV: 11.4 L/min Temp (24hrs), Avg:97.6 F (36.4 C), Min:94.6 F (34.8 C), Max:98.6 F (37 C)  Propofol : 25.9 ml/hr   Patient daughter at bedside during visit today and able to provide nutrition-related history. This was not extensive, as she and her father have been separated during extended times in recent years.  She reports that his drug use had picked back up in the last year or two after her grandmother (his mother) passed away. Did state this is the smallest she has seen her father. He had been living in [place] with his girlfriend, who was also a drug user. He moved back to the area and got a job around September 2024 and was residing at [facility], where some meals  are provided.   Admit Weight: 88.9kg Current Weight: 88.9kg  UBW endorsed 225 lbs, however this was a couple of years ago. Chart review of weights in system show stable wt trend. Wt shows as 90.7kg one year ago. Foley cath in place w/ UOP of noted yesterday.    Per abdominal xray, OGT extends into the stomach. Per chest x-ray, sideport noted to be only 2cm below hiatus and could be advanced 5-8 cm further for optimal placement.   Meds: famotidine , docusate, SSI, Miralax , IV ABX Drips: D10 @ 13mL/hr stopped at 0950 today Fentanyl  @ 22mL/hr stopped at 1103 today Propofol  @ 25.16mL/hr   Labs:  CBGs 111-144 over 24 hours A1c 5.3 (11/2023) Na+ 138 (wdl) K+ 5.2 (H) BUN 81>83>88 (H) Crt 5.19>4.85>4.95 (H)  NUTRITION - FOCUSED PHYSICAL EXAM:  Flowsheet Row Most Recent Value  Orbital Region Mild depletion  Upper Arm Region No depletion  Thoracic and Lumbar Region No depletion  Buccal Region Unable to assess  [mechanically ventilated]  Temple Region Mild depletion  Clavicle Bone Region Moderate depletion  Clavicle and Acromion Bone Region Severe depletion  Scapular Bone Region Moderate depletion  Dorsal Hand No depletion  Patellar Region No depletion  Anterior Thigh Region Mild depletion  Posterior Calf Region Moderate depletion  Edema (RD Assessment) Mild  Hair Reviewed  Eyes Reviewed  Mouth Reviewed  Skin Reviewed  Nails Reviewed   Diet Order:   Diet Order             Diet NPO time specified  Diet effective now             EDUCATION NEEDS:  Not appropriate for education at this time  Skin:  Skin Assessment: Reviewed RN Assessment  Last BM:  PTA - bowel regimen in place  Height:  Ht Readings from Last 1 Encounters:  11/07/23 5' 10 (1.778 m)   Weight:  Wt Readings from Last 1 Encounters:  11/08/23 88.9 kg    Ideal Body Weight:  75.5 kg  BMI:  Body mass index is 28.12 kg/m.  Estimated Nutritional Needs:   Kcal:  2000-2200kcal  Protein:   110-120g  Fluid:  >2L/day  Blair Deaner MS, RD, LDN Registered Dietitian Clinical Nutrition RD Inpatient Contact Info in Amion

## 2023-11-08 NOTE — Progress Notes (Signed)
  Echocardiogram 2D Echocardiogram has been performed.  Leda Roys RDCS 11/08/2023, 11:57 AM

## 2023-11-08 NOTE — Progress Notes (Signed)
 LTM EEG hooked up and running - no initial skin breakdown - push button tested - [facility] monitoring.

## 2023-11-08 NOTE — Progress Notes (Signed)
 EEG complete - results pending

## 2023-11-08 NOTE — Significant Event (Signed)
Hypoglycemic Event  CBG: 37  Treatment: D50 50 mL (25 gm)  Symptoms: None  Follow-up CBG: Time:2302 CBG Result:106  E-Link notified, new orders for D10 infusion as well.  Diann Bangerter M Cruz-Reyes

## 2023-11-08 NOTE — Progress Notes (Signed)
 NAME:  Paul Donaldson, MRN:  [id number], DOB:  [birth year], LOS: 1 ADMISSION DATE:  11/07/2023, CONSULTATION DATE:  2/3 REFERRING MD:  [facility], CHIEF COMPLAINT:  post-arrest   History of Present Illness:  Paul Donaldson is a 73 y/o gentleman with a history of cocaine abuse who presented after being found down in a parking lot. He had 50 minutes of CPR and ROSC was achieved around the time he arrived to the emergency department.  Rhythm was PEA.SABRA He was seen in the ED 2 days ago for an acute asthma exacerbation and was discharged with plans for outpatient follow-up with albuterol , prednisone , Symbicort .  In the emergency department he was started on mechanical ventilation and arterial line was placed.  He has not required vasopressors.  No family at bedside.  Pertinent  Medical History  Cocaine use HTN Asthma Former tobacco abuse  Significant Hospital Events: Including procedures, antibiotic start and stop dates in addition to other pertinent events   2/3 admitted card arrest, not on pressors. Aline placed in ED.  Interim History / Subjective:  Patient having some myoclonic jerking w/ stimulation and facial twitching Unresponsive; sluggish pupils  Objective   Blood pressure (!) 125/92, pulse 78, temperature 98.4 F (36.9 C), resp. rate 13, height 5' 10 (1.778 m), weight 88.9 kg, SpO2 100%.    Vent Mode: PRVC FiO2 (%):  [40 %-100 %] 40 % Set Rate:  [20 bmp-28 bmp] 20 bmp Vt Set:  [580 mL] 580 mL PEEP:  [5 cmH20] 5 cmH20 Plateau Pressure:  [16 cmH20-17 cmH20] 17 cmH20   Intake/Output Summary (Last 24 hours) at 11/08/2023 1005 Last data filed at 11/08/2023 0900 Gross per 24 hour  Intake 2690.44 ml  Output 690 ml  Net 2000.44 ml   Filed Weights   11/07/23 2218 11/08/23 0430  Weight: 88.9 kg 88.9 kg    Examination: General:  critically ill appearing on mech vent HEENT: MM pink/moist; ETT in place Neuro: unresponsive; sluggish pupils; some myoclonic jerking CV: s1s2, RRR, no  m/r/g PULM:  dim clear BS bilaterally; on mech vent PRVC GI: soft, bsx4 active  Extremities: warm/dry, no edema  Skin: no rashes or lesions appreciated  Resolved Hospital Problem list   Lactic acidosis post arrest, expected  Assessment & Plan:   Cardiac arrest- PEA. Not sure initial cause, concern for drug-related cause given his history. Asthma is possible, but currently dose not seem severe enough to be the most likely cuase. P: -monitor in ICU w/ continuous telemetry monitoring -trend and replete electrolytes as needed -echo -trop 32 then 131; check trop this am  Acute respiratory failure with hypoxia and hypercapnia Acute on chronic moderate persistent asthma. Possible aspiration P: -LTVV strategy with tidal volumes of 6-8 cc/kg ideal body weight -check ABG and adjust settings accordingly -Goal plateau pressures less than 30 and driving pressures less than 15 -Wean PEEP/FiO2 for SpO2 >92% -VAP bundle in place -PAD protocol in place -wean sedation for RASS goal 0 to -1 -Pulmicort , Brovana , Yupelri  plus DuoNebs as needed -rocephin  for possible aspiration  Acute encephalopathy: concern for anoxic brain injury Possible myoclonic jerking -UDS w/ cocaine P: -limit sedating meds -EEG  -increase prop -add AED -seizure precautions -will wait 48-72 hours post arrest for neuro prognostication; consider MRI  Hypercalcemia, potentially due to peri-arrest meds P: -improved -trend cmp  AKI on CKD 4 Hyperkalemia-likely due to acute acidosis P: -lokelma  this am -Trend BMP / urinary output -Replace electrolytes as indicated -Avoid nephrotoxic agents, ensure adequate renal  perfusion  Shock liver P: -trend cmp  Hypoglycemia P: -cbg monitoring -cont TF  Anemia, Chronic P: -trend cbc   Best Practice (right click and Reselect all SmartList Selections daily)   Diet/type: tubefeeds DVT prophylaxis prophylactic heparin   Pressure ulcer(s): N/A GI prophylaxis:  H2B Lines: Arterial Line Foley:  Yes, and it is still needed Code Status:  DNR Last date of multidisciplinary goals of care discussion [sister updated 2/4 at bedside. Decided DNR for now. Would want to transition to comfort care if neuro prognosis not improved in next 48-72 hours post arrest]  Labs   CBC: Recent Labs  Lab 11/07/23 1926 11/07/23 1930 11/07/23 2304 11/08/23 0422 11/08/23 0510  WBC 9.0  --   --   --  13.5*  NEUTROABS 3.2  --   --   --   --   HGB 11.2* 10.9* 10.5* 11.6* 11.2*  HCT 38.1* 32.0* 31.0* 34.0* 34.2*  MCV 103.0*  --   --   --  92.9  PLT 217  --   --   --  198    Basic Metabolic Panel: Recent Labs  Lab 11/07/23 1918 11/07/23 1926 11/07/23 1930 11/07/23 2304 11/07/23 2319 11/08/23 0417 11/08/23 0422  NA 138  138 135 133* 137 138 136 138  K 5.6*  5.6* >7.5* 8.0* 5.1 4.9 5.2* 5.2*  CL 110 106  --   --  107 110  --   CO2  --  16*  --   --  17* 18*  --   GLUCOSE 111* 144*  --   --  111* 113*  --   BUN 95* 81*  --   --  83* 88*  --   CREATININE 4.70* 5.19*  --   --  4.85* 4.95*  --   CALCIUM   --  >15.0*  --   --  9.4 8.9  --    GFR: Estimated Creatinine Clearance: 14.7 mL/min (A) (by C-G formula based on SCr of 4.95 mg/dL (H)). Recent Labs  Lab 11/07/23 1919 11/07/23 1926 11/07/23 2143 11/08/23 0417 11/08/23 0510  WBC  --  9.0  --   --  13.5*  LATICACIDVEN 6.6*  --  2.8* 0.7  --     Liver Function Tests: Recent Labs  Lab 11/07/23 1926 11/08/23 0417  AST 216* 205*  ALT 206* 205*  ALKPHOS 86 75  BILITOT 0.6 0.7  PROT 5.8* 5.5*  ALBUMIN 2.8* 2.6*   No results for input(s): LIPASE, AMYLASE in the last 168 hours. No results for input(s): AMMONIA in the last 168 hours.  ABG    Component Value Date/Time   PHART 7.363 11/08/2023 0422   PCO2ART 31.0 (L) 11/08/2023 0422   PO2ART 199 (H) 11/08/2023 0422   HCO3 17.7 (L) 11/08/2023 0422   TCO2 19 (L) 11/08/2023 0422   ACIDBASEDEF 7.0 (H) 11/08/2023 0422   O2SAT 100 11/08/2023  0422     Coagulation Profile: Recent Labs  Lab 11/08/23 0417  INR 1.2    Cardiac Enzymes: No results for input(s): CKTOTAL, CKMB, CKMBINDEX, TROPONINI in the last 168 hours.  HbA1C: Hgb A1c MFr Bld  Date/Time Value Ref Range Status  11/07/2023 07:31 PM 5.2 4.8 - 5.6 % Final    Comment:    (NOTE) Pre diabetes:          5.7%-6.4%  Diabetes:              >6.4%  Glycemic control for   <7.0% adults with diabetes  02/08/2023 02:55 PM 5.1 4.8 - 5.6 % Final    Comment:    (NOTE) Pre diabetes:          5.7%-6.4%  Diabetes:              >6.4%  Glycemic control for   <7.0% adults with diabetes     CBG: Recent Labs  Lab 11/07/23 2223 11/07/23 2227 11/07/23 2302 11/08/23 0419 11/08/23 0832  GLUCAP 11* 37* 106* 110* 100*    Review of Systems:   Unable to be obtained due to mental status  Past Medical History:  He,  has a past medical history of Asthma, Colon polyp, Dyslipidemia, Hypertension, Lower extremity edema (06/23/2020), and 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   Surgical History:   Past Surgical History:  Procedure Laterality Date   COLONOSCOPY W/ BIOPSIES  2015   TUBULAR ADENOMA (X1).   CYST EXCISION  1969   back     Social History:   reports that he quit smoking about 27 years ago. His smoking use included cigarettes. He has never used smokeless tobacco. He reports that he does not drink alcohol  and does not use drugs.   Family History:  His family history is negative for Colon cancer, Esophageal cancer, Rectal cancer, and Stomach cancer.   Allergies No Known Allergies   Home Medications  Prior to Admission medications   Medication Sig Start Date End Date Taking? Authorizing Provider  amLODipine  (NORVASC ) 10 MG tablet Take 1 tablet (10 mg total) by mouth daily. 02/09/23 03/11/23  Lee, Jacqueline Eun, NP  atorvastatin  (LIPITOR) 10 MG tablet Take 1 tablet (10 mg total) by mouth daily. Patient not taking: Reported on 08/23/2023  02/09/23   Lee, Jacqueline Eun, NP  budesonide -formoterol  (SYMBICORT ) 80-4.5 MCG/ACT inhaler Inhale 2 puffs into the lungs in the morning and at bedtime. 11/05/23   Kommor, Madison, MD  carvedilol  (COREG ) 12.5 MG tablet Take 12.5 mg by mouth 2 (two) times daily with a meal.    [provider]  fluticasone  furoate-vilanterol (BREO ELLIPTA ) 200-25 MCG/ACT AEPB Inhale 1 puff into the lungs daily. 02/10/23   Lee, Jacqueline Eun, NP  predniSONE  (DELTASONE ) 10 MG tablet Take 4 tablets (40 mg total) by mouth daily for 4 days. 11/05/23 11/14/2023  Kommor, Lum, MD     Critical care time: 35 min.    JD Emilio RIGGERS [facility] 11/08/2023, 11:05 AM  Please see [email or web address] for pager details.  From 7A-7P if no response, please call [phone]. After hours, please call ELink [phone].

## 2023-11-08 NOTE — Progress Notes (Signed)
Pt transported form 2H13 to CT and back with no complications

## 2023-11-08 NOTE — Progress Notes (Signed)
   11/08/23 1315  Spiritual Encounters  Type of Visit Initial  Care provided to: [facility] partners present during encounter Nurse  Reason for visit End-of-life  OnCall Visit No  Spiritual Framework  Presenting Themes Rituals and practive  Community/Connection Family  Family Stress Factors Major life changes;Loss  Interventions  Spiritual Care Interventions Made Prayer;Compassionate presence;Supported grief process  Intervention Outcomes  Outcomes Connection to spiritual care;Awareness around self/spiritual resourses;Reduced fear;Connected to spiritual community    Chaplain responded to spiritual consult. Reason for the consult was listed as CPR. RN indicated that pt had received 50 min of CPR the day prior, and that daughter Maybell welcomed a chaplain for prayer.  Pt unresponsive. Pt's sister was present in room upon arrival. RN led us  to consult room together, where chaplain provided compassionate presence and prayer at family's request. Sister requested specifically that I pray for bonding among family members during this time. Chaplain also made family aware of our availability to provide spiritual support if/when needed.

## 2023-11-08 NOTE — Procedures (Signed)
 Patient Name: Paul Donaldson  MRN: [id number]  Epilepsy Attending: Arlin MALVA Krebs  Referring Physician/Provider: Gretta Leita SQUIBB, DO  Date: 11/08/2023 Duration: 24.48 mins  Patient history: 73 year old man status post cardiac arrest.  EEG to evaluate for seizure activity.  Level of alertness:  comatose  AEDs during EEG study: Propofol   Technical aspects: This EEG study was done with scalp electrodes positioned according to the 10-20 International system of electrode placement. Electrical activity was reviewed with band pass filter of 1-70Hz , sensitivity of 7 uV/mm, display speed of 59mm/sec with a 60Hz  notched filter applied as appropriate. EEG data were recorded continuously and digitally stored.  Video monitoring was available and reviewed as appropriate.  Description:  Patient was noted to have episodes of brief sudden eye opening. Concomitant EEG showed generalized polyspikes consistent with myoclonic seizures lasting for 10 to 15 seconds.  In between seizures EEG showed generalized background suppression lasting for 30 seconds to 1 minute. Hyperventilation and photic stimulation were not performed.     ABNORMALITY -Myoclonic seizure, generalized -Background suppression, generalized  IMPRESSION: Patient was noted to have myoclonic seizures lasting for 10 to 15 seconds every 30 seconds to 1 minute.  Additionally there was evidence of profound diffuse encephalopathy.  In the setting of cardiac arrest, this EEG pattern is suggestive of anoxic/hypoxic brain injury.  Dr. Harold was notified.  Elpidia Karn O Hilari Wethington

## 2023-11-09 ENCOUNTER — Inpatient Hospital Stay (HOSPITAL_COMMUNITY): Payer: Medicare (Managed Care)

## 2023-11-09 DIAGNOSIS — J4540 Moderate persistent asthma, uncomplicated: Secondary | ICD-10-CM | POA: Diagnosis not present

## 2023-11-09 DIAGNOSIS — J9602 Acute respiratory failure with hypercapnia: Secondary | ICD-10-CM | POA: Diagnosis not present

## 2023-11-09 DIAGNOSIS — Z7189 Other specified counseling: Secondary | ICD-10-CM

## 2023-11-09 DIAGNOSIS — I469 Cardiac arrest, cause unspecified: Secondary | ICD-10-CM | POA: Diagnosis not present

## 2023-11-09 DIAGNOSIS — J9601 Acute respiratory failure with hypoxia: Secondary | ICD-10-CM | POA: Diagnosis not present

## 2023-11-09 DIAGNOSIS — R569 Unspecified convulsions: Secondary | ICD-10-CM | POA: Diagnosis not present

## 2023-11-09 LAB — COMPREHENSIVE METABOLIC PANEL
ALT: 129 U/L — ABNORMAL HIGH (ref 0–44)
AST: 103 U/L — ABNORMAL HIGH (ref 15–41)
Albumin: 2.3 g/dL — ABNORMAL LOW (ref 3.5–5.0)
Alkaline Phosphatase: 62 U/L (ref 38–126)
Anion gap: 11 (ref 5–15)
BUN: 89 mg/dL — ABNORMAL HIGH (ref 8–23)
CO2: 17 mmol/L — ABNORMAL LOW (ref 22–32)
Calcium: 8.4 mg/dL — ABNORMAL LOW (ref 8.9–10.3)
Chloride: 111 mmol/L (ref 98–111)
Creatinine, Ser: 4.76 mg/dL — ABNORMAL HIGH (ref 0.61–1.24)
GFR, Estimated: 12 mL/min — ABNORMAL LOW (ref >60)
Glucose, Bld: 118 mg/dL — ABNORMAL HIGH (ref 70–99)
Potassium: 4.8 mmol/L (ref 3.5–5.1)
Sodium: 139 mmol/L (ref 135–145)
Total Bilirubin: 0.3 mg/dL (ref 0.0–1.2)
Total Protein: 5.4 g/dL — ABNORMAL LOW (ref 6.5–8.1)

## 2023-11-09 LAB — CBC
HCT: 38.4 % — ABNORMAL LOW (ref 39.0–52.0)
Hemoglobin: 12.6 g/dL — ABNORMAL LOW (ref 13.0–17.0)
MCH: 30.4 pg (ref 26.0–34.0)
MCHC: 32.8 g/dL (ref 30.0–36.0)
MCV: 92.5 fL (ref 80.0–100.0)
Platelets: 175 10*3/uL (ref 150–400)
RBC: 4.15 MIL/uL — ABNORMAL LOW (ref 4.22–5.81)
RDW: 15.9 % — ABNORMAL HIGH (ref 11.5–15.5)
WBC: 6.8 10*3/uL (ref 4.0–10.5)
nRBC: 0 % (ref 0.0–0.2)

## 2023-11-09 LAB — PROTIME-INR
INR: 1.1 (ref 0.8–1.2)
Prothrombin Time: 14.8 s (ref 11.4–15.2)

## 2023-11-09 LAB — POCT I-STAT 7, (LYTES, BLD GAS, ICA,H+H)
Acid-base deficit: 7 mmol/L — ABNORMAL HIGH (ref 0.0–2.0)
Bicarbonate: 17.7 mmol/L — ABNORMAL LOW (ref 20.0–28.0)
Calcium, Ion: 1.2 mmol/L (ref 1.15–1.40)
HCT: 38 % — ABNORMAL LOW (ref 39.0–52.0)
Hemoglobin: 12.9 g/dL — ABNORMAL LOW (ref 13.0–17.0)
O2 Saturation: 94 %
Patient temperature: 36.9
Potassium: 4.6 mmol/L (ref 3.5–5.1)
Sodium: 141 mmol/L (ref 135–145)
TCO2: 19 mmol/L — ABNORMAL LOW (ref 22–32)
pCO2 arterial: 33.4 mm[Hg] (ref 32–48)
pH, Arterial: 7.332 — ABNORMAL LOW (ref 7.35–7.45)
pO2, Arterial: 75 mm[Hg] — ABNORMAL LOW (ref 83–108)

## 2023-11-09 LAB — GLUCOSE, CAPILLARY
Glucose-Capillary: 111 mg/dL — ABNORMAL HIGH (ref 70–99)
Glucose-Capillary: 115 mg/dL — ABNORMAL HIGH (ref 70–99)
Glucose-Capillary: 134 mg/dL — ABNORMAL HIGH (ref 70–99)
Glucose-Capillary: 94 mg/dL (ref 70–99)

## 2023-11-09 LAB — PHOSPHORUS: Phosphorus: 4.4 mg/dL (ref 2.5–4.6)

## 2023-11-09 LAB — URINE CULTURE

## 2023-11-09 LAB — MAGNESIUM: Magnesium: 2 mg/dL (ref 1.7–2.4)

## 2023-11-09 MED ORDER — GLYCOPYRROLATE 0.2 MG/ML IJ SOLN: 0.2000 mg | INTRAMUSCULAR | Status: DC | PRN

## 2023-11-09 MED ORDER — POLYVINYL ALCOHOL 1.4 % OP SOLN: 1.0000 [drp] | Freq: Four times a day (QID) | OPHTHALMIC | Status: DC | PRN

## 2023-11-09 MED ORDER — GLYCOPYRROLATE 0.2 MG/ML IJ SOLN
0.2000 mg | INTRAMUSCULAR | Status: DC | PRN
  Administered 2023-11-09 (×2): 0.2 mg via INTRAVENOUS
  Filled 2023-11-09 (×2): qty 1

## 2023-11-09 MED ORDER — MIDAZOLAM HCL 2 MG/2ML IJ SOLN
2.0000 mg | INTRAMUSCULAR | Status: DC | PRN
  Administered 2023-11-09: 2 mg via INTRAVENOUS
  Filled 2023-11-09: qty 2

## 2023-11-09 MED ORDER — MORPHINE 100MG IN NS 100ML (1MG/ML) PREMIX INFUSION
0.0000 mg/h | INTRAVENOUS | Status: DC
  Administered 2023-11-09: 5 mg/h via INTRAVENOUS
  Administered 2023-11-09: 20 mg/h via INTRAVENOUS
  Filled 2023-11-09 (×2): qty 100

## 2023-11-09 MED ORDER — MORPHINE BOLUS VIA INFUSION
5.0000 mg | INTRAVENOUS | Status: DC | PRN
  Administered 2023-11-09 (×4): 5 mg via INTRAVENOUS

## 2023-11-09 MED ORDER — GLYCOPYRROLATE 1 MG PO TABS: 1.0000 mg | ORAL_TABLET | ORAL | Status: DC | PRN

## 2023-12-03 NOTE — Procedures (Addendum)
 Patient Name: Paul Donaldson  MRN: [id number]  Epilepsy Attending: Arlin MALVA Krebs  Referring Physician/Provider: Harold Scholz, MD  Duration: 11/08/2023 1018 to 12/04/2023 0815   Patient history: 73 year old man status post cardiac arrest.  EEG to evaluate for seizure activity.   Level of alertness:  comatose   AEDs during EEG study: Propofol , LEV   Technical aspects: This EEG study was done with scalp electrodes positioned according to the 10-20 International system of electrode placement. Electrical activity was reviewed with band pass filter of 1-70Hz , sensitivity of 7 uV/mm, display speed of 31mm/sec with a 60Hz  notched filter applied as appropriate. EEG data were recorded continuously and digitally stored.  Video monitoring was available and reviewed as appropriate.   Description:  EEG initially showed burst suppression with burst of 5-7hz  theta slowing lasting 3-8 seconds alternating with 15-20 seconds of suppression. After around 0200 on December 04, 2023, EEG worsened and showed generalized suppression, not reactive to stimulation. Hyperventilation and photic stimulation were not performed.      ABNORMALITY - Burst suppression, generalized   IMPRESSION: This study initially showed evidence of severe encephalopathy.  After around 2 AM, EEG worsened and was suggestive of profound diffuse encephalopathy.  No definite seizures were noted.   Berdena Cisek O Delsie Amador

## 2023-12-03 NOTE — IPAL (Signed)
  Interdisciplinary Goals of Care Family Meeting   Date carried out: 2023/11/18  Location of the meeting: Bedside  Member's involved: Family Member or next of kin and Other: PA-C  Durable Power of Insurance risk surveyor: daughter Maybell  Discussion: We discussed goals of care for Paul Donaldson .  Spoke w/ daughter Maybell and her mother at bedside. Repeat CT head w/ worsening cerebral edema from anoxic brain injury. Neuro exam not improved. Daughter states that she would like to transition to comfort care given his poor neurologic prognosis. Comfort care orders placed. Waiting on other family members to visit and they will let us  know when they are ready to extubate.  Code status:   Code Status: Do not attempt resuscitation (DNR) - Comfort care   Disposition: In-patient comfort care  Time spent for the meeting: 35 minutes    Norleen JONETTA Cedar, PA-C  2023-11-18, 3:05 PM

## 2023-12-03 NOTE — Progress Notes (Signed)
 I responded to a page from the nurse to provide spiritual support for the patient's family. I arrived at the patient's room where several family members were present. I provided spiritual support through pastoral presence, by reading scripture, sharing words of encouragement, and leading in prayer.    December 05, 2023 1929  Spiritual Encounters  Type of Visit Initial  Care provided to: Pt and family  Conversation partners present during encounter Nurse  Referral source Nurse (RN/NT/LPN)  Reason for visit Urgent spiritual support  OnCall Visit Yes  Interventions  Spiritual Care Interventions Made Compassionate presence;Prayer;Encouragement    Chaplain Dr Ozell Law

## 2023-12-03 NOTE — Death Summary Note (Signed)
 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 and brain compression Acute respiratory failure with hypoxia and hypercapnia Acute asthma exacerbation Probable aspiration pneumonia Demand cardiac ischemia from CPR Acute anoxic encephalopathy AKI on CKD stage IV Hyperkalemia Shock liver Anemia of chronic disease  Diagnoses  Preliminary cause of death: Withdrawal of care in the setting of severe anoxic brain injury with diffuse cerebral edema and brain compression postcardiac arrest Secondary Diagnoses (including complications and co-morbidities):  Principal Problem:   Cardiac arrest [facility]) Active Problems:   Goals of care, counseling/discussion   Brief Hospital Course (including significant findings, care, treatment, and services provided and events leading to death)  GER RINGENBERG is a 73 y.o. year old male with a history of cocaine abuse who presented after being found down in a parking lot. He had 50 minutes of CPR and ROSC was achieved around the time he arrived to the emergency department. Rhythm was PEA.SABRA He was seen in the ED 2 days ago for an acute asthma exacerbation and was discharged with plans for outpatient follow-up with albuterol , prednisone , Symbicort . In the emergency department he was started on mechanical ventilation  Patient initial head CT was consistent with anoxic brain injury, he was started on EEG which showed myoclonic seizures.  Neurology was consulted, he was placed on TTM, he was continued on propofol .  Repeat head CT was done which showed diffuse severe anoxic brain injury with cerebral edema  and brain compression.  Goals of care discussions were carried with family, considering severe anoxic brain injury patient's family decided to proceed with comfort care and palliative extubation.  Patient was palliatively extubated and he passed on Dec 06, 2023 at 10:58 PM.  Patient's family was at bedside   Pertinent Labs and Studies  Significant Diagnostic Studies CT HEAD WO CONTRAST ( ) Result Date: 2023-12-06 CLINICAL DATA:  Altered mental status.  Cardiac arrest. EXAM: CT HEAD WITHOUT CONTRAST TECHNIQUE: Contiguous axial images were obtained from the base of the skull through the vertex without intravenous contrast. RADIATION DOSE REDUCTION: This exam was performed according to the departmental dose-optimization program which includes automated exposure control, adjustment of the mA and/or kV according to patient size and/or use of iterative reconstruction technique. COMPARISON:  Head CT 11/08/2023 FINDINGS: Brain: There is progressively diminished gray-differentiation diffusely with progressive sulcal effacement and slight ventricular effacement consistent with worsening diffuse cerebral edema. There is also likely diffuse cerebellar involvement. There is no midline shift or tonsillar herniation. No acute intracranial hemorrhage, hydrocephalus, or extra-axial fluid collection is evident. Vascular: Calcified atherosclerosis at the skull base. No hyperdense vessel. Skull: No acute fracture. Nonspecific 1 cm lucent focus in the left frontal skull, unchanged from 11/17/2022. Sinuses/Orbits: Mild mucosal thickening in the paranasal sinuses. Clear mastoid air cells. Remote bilateral medial orbital fractures. Other: Small left lateral scalp lipoma. IMPRESSION: Worsening cerebral edema consistent with diffuse hypoxic ischemic injury. Electronically Signed   By: Dasie Hamburg M.D.   On: 12-06-2023 14:46   Overnight EEG with video Result Date: 12/06/23 Shelton Arlin KIDD, MD     12-06-23 10:04 AM Patient Name:  Paul Donaldson MRN: [id number] Epilepsy Attending: Arlin KIDD  Shelton Referring Physician/Provider: Harold Scholz, MD Duration: 11/08/2023 1018 to Dec 01, 2023 0815  Patient history: 73 year old man status post cardiac arrest.  EEG to evaluate for seizure activity.  Level of alertness:  comatose  AEDs during EEG study: Propofol , LEV  Technical aspects: This EEG study was done with scalp electrodes positioned according to the 10-20 International system of electrode placement. Electrical activity was reviewed with band pass filter of 1-70Hz , sensitivity of 7 uV/mm, display speed of 15mm/sec with a 60Hz  notched filter applied as appropriate. EEG data were recorded continuously and digitally stored.  Video monitoring was available and reviewed as appropriate.  Description:  EEG initially showed burst suppression with burst of 5-7hz  theta slowing lasting 3-8 seconds alternating with 15-20 seconds of suppression. After around 0200 on 12-01-23, EEG worsened and showed generalized suppression, not reactive to stimulation. Hyperventilation and photic stimulation were not performed.    ABNORMALITY - Burst suppression, generalized  IMPRESSION: This study initially showed evidence of severe encephalopathy.  After around 2 AM, EEG worsened and was suggestive of profound diffuse encephalopathy.  No definite seizures were noted.  Arlin MALVA Shelton   ECHOCARDIOGRAM COMPLETE Result Date: 11/08/2023    ECHOCARDIOGRAM REPORT   Patient Name:   Paul Donaldson Date of Exam: 11/08/2023 Medical Rec #:  [id number]        Height:       70.0 in Accession #:    [id number]       Weight:       196.0 lb Date of Birth:  09-19-1951        BSA:          2.069 m Patient Age:    72 years         BP:           154/66 mmHg Patient Gender: M                HR:           80 bpm. Exam Location:  Inpatient Procedure: 2D Echo, Color Doppler, Cardiac Doppler and Intracardiac            Opacification Agent Indications:    Cardiac Arrest I46.9  History:        Patient  has no prior history of Echocardiogram examinations.  Sonographer:    Tinnie Gosling RDCS Referring Phys: [id number] LAURA P CLARK IMPRESSIONS  1. Left ventricular ejection fraction, by estimation, is 70 to 75%. The left ventricle has hyperdynamic function. The left ventricle has no regional wall motion abnormalities. There is mild concentric left ventricular hypertrophy. Left ventricular diastolic parameters are consistent with Grade I diastolic dysfunction (impaired relaxation).  2. Right ventricular systolic function is normal. The right ventricular size is normal. Tricuspid regurgitation signal is inadequate for assessing PA pressure.  3. The mitral valve is normal in structure. No evidence of mitral valve regurgitation.  4. The aortic valve is tricuspid. Aortic valve regurgitation is not visualized. No aortic stenosis is present. FINDINGS  Left Ventricle: Left ventricular ejection fraction, by estimation, is 70 to 75%. The left ventricle has hyperdynamic function. The left ventricle has no regional wall motion abnormalities. Definity  contrast agent was given IV to delineate the left ventricular endocardial borders. The left ventricular internal cavity size was small. There is mild concentric left ventricular hypertrophy. Left ventricular diastolic parameters are consistent with Grade I diastolic dysfunction (impaired relaxation). Right Ventricle: The right ventricular size is normal. No increase in right ventricular wall thickness. Right ventricular systolic function  is normal. Tricuspid regurgitation signal is inadequate for assessing PA pressure. Left Atrium: Left atrial size was normal in size. Right Atrium: Right atrial size was not well visualized. Pericardium: There is no evidence of pericardial effusion. Mitral Valve: The mitral valve is normal in structure. No evidence of mitral valve regurgitation. Tricuspid Valve: The tricuspid valve is normal in structure. Tricuspid valve regurgitation is not  demonstrated. Aortic Valve: The aortic valve is tricuspid. Aortic valve regurgitation is not visualized. No aortic stenosis is present. Pulmonic Valve: The pulmonic valve was grossly normal. Pulmonic valve regurgitation is not visualized. No evidence of pulmonic stenosis. Aorta: The aortic root and ascending aorta are structurally normal, with no evidence of dilitation. Venous: IVC assessment for right atrial pressure unable to be performed due to mechanical ventilation. IAS/Shunts: The interatrial septum was not well visualized.  LEFT VENTRICLE PLAX 2D LVIDd:         4.40 cm   Diastology LVIDs:         2.70 cm   LV e' medial:    5.00 cm/s LV PW:         1.40 cm   LV E/e' medial:  9.8 LV IVS:        1.40 cm   LV e' lateral:   4.46 cm/s LVOT diam:     2.20 cm   LV E/e' lateral: 10.9 LV SV:         69 LV SV Index:   33 LVOT Area:     3.80 cm  RIGHT VENTRICLE             IVC RV S prime:     12.50 cm/s  IVC diam: 2.50 cm TAPSE (M-mode): 2.7 cm LEFT ATRIUM           Index LA diam:      3.50 cm 1.69 cm/m LA Vol (A4C): 48.8 ml 23.58 ml/m  AORTIC VALVE LVOT Vmax:   81.60 cm/s LVOT Vmean:  54.400 cm/s LVOT VTI:    0.181 m  AORTA Ao Root diam: 3.10 cm Ao Asc diam:  3.40 cm MITRAL VALVE MV Area (PHT): 3.46 cm    SHUNTS MV Decel Time: 219 msec    Systemic VTI:  0.18 m MV E velocity: 48.80 cm/s  Systemic Diam: 2.20 cm MV A velocity: 75.40 cm/s MV E/A ratio:  0.65 Mihai Croitoru MD Electronically signed by Jerel Balding MD Signature Date/Time: 11/08/2023/12:49:37 PM    Final    EEG adult Result Date: 11/08/2023 Shelton Arlin KIDD, MD     11/08/2023 10:36 AM Patient Name: KEVEN OSBORN MRN: [id number] Epilepsy Attending: Arlin KIDD Shelton Referring Physician/Provider: Gretta Leita SQUIBB, DO Date: 11/08/2023 Duration: 24.48 mins Patient history: 73 year old man status post cardiac arrest.  EEG to evaluate for seizure activity. Level of alertness:  comatose AEDs during EEG study: Propofol  Technical aspects: This EEG study was done with  scalp electrodes positioned according to the 10-20 International system of electrode placement. Electrical activity was reviewed with band pass filter of 1-70Hz , sensitivity of 7 uV/mm, display speed of 50mm/sec with a 60Hz  notched filter applied as appropriate. EEG data were recorded continuously and digitally stored.  Video monitoring was available and reviewed as appropriate. Description:  Patient was noted to have episodes of brief sudden eye opening. Concomitant EEG showed generalized polyspikes consistent with myoclonic seizures lasting for 10 to 15 seconds.  In between seizures EEG showed generalized background suppression lasting for 30 seconds to 1 minute. Hyperventilation and photic stimulation  were not performed.   ABNORMALITY -Myoclonic seizure, generalized -Background suppression, generalized IMPRESSION: Patient was noted to have myoclonic seizures lasting for 10 to 15 seconds every 30 seconds to 1 minute.  Additionally there was evidence of profound diffuse encephalopathy.  In the setting of cardiac arrest, this EEG pattern is suggestive of anoxic/hypoxic brain injury. Dr. Harold was notified. Arlin MALVA Krebs   CT Cervical Spine Wo Contrast Result Date: 11/08/2023 CLINICAL DATA:  Trauma EXAM: CT CERVICAL SPINE WITHOUT CONTRAST TECHNIQUE: Multidetector CT imaging of the cervical spine was performed without intravenous contrast. Multiplanar CT image reconstructions were also generated. RADIATION DOSE REDUCTION: This exam was performed according to the departmental dose-optimization program which includes automated exposure control, adjustment of the mA and/or kV according to patient size and/or use of iterative reconstruction technique. COMPARISON:  None Available. FINDINGS: Alignment: No static subluxation. Facets are aligned. Occipital condyles and the lateral masses of C1 and C2 are normally approximated. Skull base and vertebrae: No acute fracture. Soft tissues and spinal canal: No prevertebral  fluid or swelling. No visible canal hematoma. Disc levels: No advanced spinal canal or neural foraminal stenosis. Upper chest: No pneumothorax, pulmonary nodule or pleural effusion. Other: Normal visualized paraspinal cervical soft tissues. IMPRESSION: No acute fracture or static subluxation of the cervical spine. Electronically Signed   By: Franky Stanford M.D.   On: 11/08/2023 01:56   CT Head Wo Contrast Result Date: 11/08/2023 CLINICAL DATA:  Initial evaluation for mental status change, post cardiac arrest. EXAM: CT HEAD WITHOUT CONTRAST TECHNIQUE: Contiguous axial images were obtained from the base of the skull through the vertex without intravenous contrast. RADIATION DOSE REDUCTION: This exam was performed according to the departmental dose-optimization program which includes automated exposure control, adjustment of the mA and/or kV according to patient size and/or use of iterative reconstruction technique. COMPARISON:  CT from 11/17/2022 FINDINGS: Brain: Subtle loss of gray-white matter differentiation and cortical sulcation since prior, concerning for developing cerebral edema, likely due to anoxic brain injury given history of cardiac arrest. No acute intracranial hemorrhage. No acute cortically based infarct. No mass lesion or midline shift. No hydrocephalus or extra-axial fluid collection. Vascular: No abnormal hyperdense vessel. Scattered vascular calcifications noted within the carotid siphons. Skull: Scalp soft tissues demonstrate no acute finding. Small lipoma noted at the left scalp. Calvarium intact. Sinuses/Orbits: Remote posttraumatic defect noted about the lamina papyracea bilaterally. Scattered mucosal thickening noted about the ethmoidal air cells and maxillary sinuses. Mastoid air cells are largely clear. Other: Endotracheal and enteric to use partially visualized. IMPRESSION: 1. Subtle loss of gray-white matter differentiation and cortical sulcation, concerning for developing cerebral edema,  likely due to anoxic brain injury given history of cardiac arrest. 2. No other acute intracranial abnormality. Electronically Signed   By: Morene Hoard M.D.   On: 11/08/2023 01:56   DG Abd Portable 1V Result Date: 11/07/2023 CLINICAL DATA:  [id number]. Imaging study to confirm orogastric tube placement. EXAM: PORTABLE ABDOMEN - 1 VIEW COMPARISON:  CT abdomen pelvis without contrast 04/23/2020 FINDINGS: Electrical pads overlie the field on the right. NGT is within the stomach, but the side-hole is only 2 cm below the hiatus and could be advanced further in 5-8 cm for optimal placement. The visualized bowel pattern is nonobstructive. The true pelvis was not included in the exam. There is no supine evidence of free air or other significant radiographic findings. Lung bases are clear. IMPRESSION: NGT is within the stomach, but the side-hole is only 2 cm below the hiatus  and could be advanced further in 5-8 cm for optimal placement. Electronically Signed   By: Francis Quam M.D.   On: 11/07/2023 23:25   DG Chest Portable 1 View Result Date: 11/07/2023 CLINICAL DATA:  Acute respiratory failure. Endotracheal tube placement. EXAM: PORTABLE CHEST 1 VIEW COMPARISON:  11/05/2023 FINDINGS: Endotracheal tube is seen in place, with tip approximately 5 cm above the carina. Both lungs are well-aerated and clear. Heart size is normal. Right-sided aortic arch again noted. IMPRESSION: Endotracheal tube tip approximately 5 cm above the carina. No active lung disease. Congenital right-sided aortic arch. Electronically Signed   By: Norleen DELENA Kil M.D.   On: 11/07/2023 20:24   DG Chest Portable 1 View Result Date: 11/05/2023 CLINICAL DATA:  Dyspnea EXAM: PORTABLE CHEST 1 VIEW COMPARISON:  02/28/2023 FINDINGS: Heart size and mediastinal contours are unremarkable. There is no pleural fluid, interstitial edema, or airspace disease. Visualized osseous structures appear intact. IMPRESSION: No active disease. Electronically Signed    By: Waddell Calk M.D.   On: 11/05/2023 20:07    Microbiology Recent Results (from the past 240 hours)  MRSA Next Gen by PCR, Nasal     Status: None   Collection Time: 11/07/23 10:14 PM   Specimen: Nasal Mucosa; Nasal Swab  Result Value Ref Range Status   MRSA by PCR Next Gen NOT DETECTED NOT DETECTED Final    Comment: (NOTE) The GeneXpert MRSA Assay (FDA approved for NASAL specimens only), is one component of a comprehensive MRSA colonization surveillance program. It is not intended to diagnose MRSA infection nor to guide or monitor treatment for MRSA infections. Test performance is not FDA approved in patients less than 31 years old. Performed at [facility], [address] [place], [place] [address]   Urine Culture     Status: None   Collection Time: 11/08/23  2:52 AM   Specimen: Urine, Catheterized  Result Value Ref Range Status   Specimen Description URINE, CATHETERIZED  Final   Special Requests NONE Reflexed from [id number]  Final   Culture   Final    NO GROWTH Performed at [facility], [address], [place], [place] [address]    Report Status 12-07-23 FINAL  Final    Lab Basic Metabolic Panel: Recent Labs  Lab 11/07/23 1918 11/07/23 1926 11/07/23 1930 11/07/23 2319 11/08/23 0417 11/08/23 0422 11/08/23 1455 Dec 07, 2023 0414 2023-12-07 0422  NA 138  138 135   < > 138 136 138  --  139 141  K 5.6*  5.6* >7.5*   < > 4.9 5.2* 5.2*  --  4.8 4.6  CL 110 106  --  107 110  --   --  111  --   CO2  --  16*  --  17* 18*  --   --  17*  --   GLUCOSE 111* 144*  --  111* 113*  --   --  118*  --   BUN 95* 81*  --  83* 88*  --   --  89*  --   CREATININE 4.70* 5.19*  --  4.85* 4.95*  --   --  4.76*  --   CALCIUM   --  >15.0*  --  9.4 8.9  --   --  8.4*  --   MG  --   --   --   --   --   --  2.0 2.0  --   PHOS  --   --   --   --   --   --  3.5 4.4  --    < > = values in this interval not displayed.   Liver Function Tests: Recent Labs  Lab 11/07/23 1926  11/08/23 0417 11/10/2023 0414  AST 216* 205* 103*  ALT 206* 205* 129*  ALKPHOS 86 75 62  BILITOT 0.6 0.7 0.3  PROT 5.8* 5.5* 5.4*  ALBUMIN 2.8* 2.6* 2.3*   No results for input(s): LIPASE, AMYLASE in the last 168 hours. No results for input(s): AMMONIA in the last 168 hours. CBC: Recent Labs  Lab 11/07/23 1926 11/07/23 1930 11/07/23 2304 11/08/23 0422 11/08/23 0510 11/10/23 0414 2023/11/10 0422  WBC 9.0  --   --   --  13.5* 6.8  --   NEUTROABS 3.2  --   --   --   --   --   --   HGB 11.2*   < > 10.5* 11.6* 11.2* 12.6* 12.9*  HCT 38.1*   < > 31.0* 34.0* 34.2* 38.4* 38.0*  MCV 103.0*  --   --   --  92.9 92.5  --   PLT 217  --   --   --  198 175  --    < > = values in this interval not displayed.   Cardiac Enzymes: No results for input(s): CKTOTAL, CKMB, CKMBINDEX, TROPONINI in the last 168 hours. Sepsis Labs: Recent Labs  Lab 11/07/23 1919 11/07/23 1926 11/07/23 2143 11/08/23 0417 11/08/23 0510 Nov 10, 2023 0414  WBC  --  9.0  --   --  13.5* 6.8  LATICACIDVEN 6.6*  --  2.8* 0.7  --   --     Procedures/Operations    [place] 11/10/2023, 1:13 PM
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
## 2023-12-03 NOTE — Procedures (Signed)
 Extubation Procedure Note  Patient Details:   Name: Paul Donaldson DOB: 19-Apr-1951 MRN: [id number]    Evaluation  O2 sats: stable throughout Complications: No apparent complications Patient did tolerate procedure well. Bilateral Breath Sounds: Clear   Yes Patient extubated to comfort care per order.  Harlene LITTIE Claudene Dasie November 29, 2023, 8:42 PM

## 2023-12-03 NOTE — Progress Notes (Signed)
Patient was transported to CT & back to 2H13 without any complications.

## 2023-12-03 NOTE — Progress Notes (Signed)
 vLTM discontinued  No skin breakdown noted at all skin sites  [facility] notified

## 2023-12-03 DEATH — deceased
